# Patient Record
Sex: Male | Born: 1965 | Race: White | Hispanic: No | Marital: Married | Smoking: Never smoker
Health system: Southern US, Community
[De-identification: ages and names within clinical notes are randomized; demographics above are authoritative.]

## PROBLEM LIST (undated history)

## (undated) DIAGNOSIS — I2510 Atherosclerotic heart disease of native coronary artery without angina pectoris: Secondary | ICD-10-CM

## (undated) DIAGNOSIS — E785 Hyperlipidemia, unspecified: Secondary | ICD-10-CM

## (undated) DIAGNOSIS — E119 Type 2 diabetes mellitus without complications: Secondary | ICD-10-CM

## (undated) DIAGNOSIS — R42 Dizziness and giddiness: Secondary | ICD-10-CM

## (undated) DIAGNOSIS — I10 Essential (primary) hypertension: Secondary | ICD-10-CM

## (undated) HISTORY — PX: CARDIAC CATHETERIZATION: SHX172

---

## 1999-03-15 ENCOUNTER — Encounter: Admission: RE | Admit: 1999-03-15 | Discharge: 1999-06-13 | Payer: Self-pay | Admitting: *Deleted

## 2005-12-17 ENCOUNTER — Encounter: Admission: RE | Admit: 2005-12-17 | Discharge: 2005-12-17 | Payer: Self-pay | Admitting: Sports Medicine

## 2007-09-15 ENCOUNTER — Ambulatory Visit: Payer: Self-pay | Admitting: Internal Medicine

## 2007-10-06 ENCOUNTER — Ambulatory Visit: Payer: Self-pay | Admitting: Internal Medicine

## 2007-10-06 ENCOUNTER — Encounter: Payer: Self-pay | Admitting: Internal Medicine

## 2007-10-06 ENCOUNTER — Ambulatory Visit: Payer: Self-pay

## 2008-11-12 ENCOUNTER — Inpatient Hospital Stay (HOSPITAL_COMMUNITY): Admission: EM | Admit: 2008-11-12 | Discharge: 2008-11-13 | Payer: Self-pay | Admitting: Emergency Medicine

## 2009-10-19 IMAGING — CR DG ANKLE COMPLETE 3+V*R*
1 series · 1 of 1 positions shown · non-contrast
Comparison: None

CLINICAL DATA: Fall off of the roof.  Right leg and ankle trauma
and pain.

RIGHT ANKLE - COMPLETE 3+ VIEW

[t ankle joint lat right]
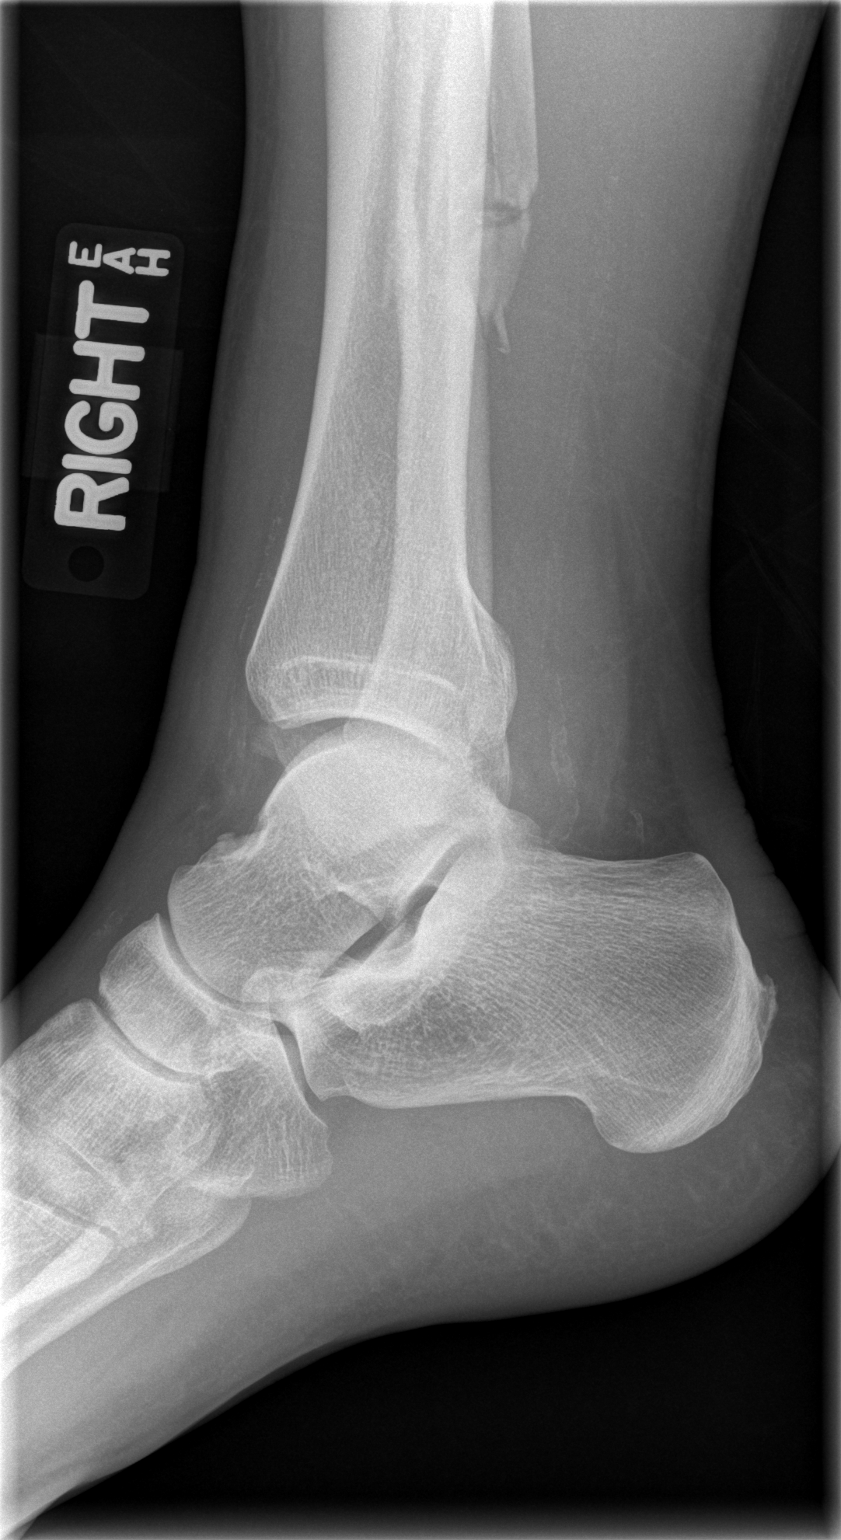

[1 of 1 positions shown; findings below may reference images not displayed]

FINDINGS: A comminuted fracture of the distal fibular shaft is
again seen.  A transverse fracture is also seen involving the
medial malleolus.  Probable posterior malleolar fractures also seen
on the lateral projection.  Lateral subluxation of the talus is
seen as well as lateral displacement of the medial malleolar
fracture fragment.

Peripheral vascular calcification is noted.
IMPRESSION: 1.  Comminuted fracture of the distal fibular shaft with mild
lateral angulation.
2.  Displaced fracture through the lateral malleolus and lateral
subluxation of the talus.
3.  Probable posterior malleolar fracture also noted on lateral
projection.

## 2010-11-14 ENCOUNTER — Telehealth (INDEPENDENT_AMBULATORY_CARE_PROVIDER_SITE_OTHER): Payer: Self-pay | Admitting: *Deleted

## 2010-12-27 NOTE — Progress Notes (Signed)
  Stress,12 Lead faxed to [facility] @ [phone] [facility]  November 14, 2010 9:23 AM

## 2011-04-09 NOTE — Op Note (Signed)
Anthony Townsend, Anthony Townsend                ACCOUNT NO.:  [id number]   MEDICAL RECORD NO.:  [id number]          PATIENT TYPE:  INP   LOCATION:  1826                         FACILITY:  [facility]   PHYSICIAN:  Claude Manges. Whitfield, M.D.DATE OF BIRTH:  Apr 18, 1966   DATE OF PROCEDURE:  11/12/2008  DATE OF DISCHARGE:                               OPERATIVE REPORT   PREOPERATIVE DIAGNOSIS:  Displaced medial malleolus fracture, right  ankle with diastasis of distal tibia-fibular joint.   POSTOPERATIVE DIAGNOSIS:  Displaced medial malleolus fracture, right  ankle with diastasis of distal tibia-fibular joint.   PROCEDURES:  1. Open reduction and internal fixation of medial malleolus fracture.  2. Open reduction and internal fixation of distal tibia-fibular joint      diastasis.   SURGEON:  Claude Manges. Cleophas Dunker, MD   ASSISTANT:  Oris Drone. Petrarca, PA-C   ANESTHESIA:  General.   COMPLICATIONS:  None.   PROCEDURE:  With the patient comfortable in the operating table and  under general orotracheal anesthesia, the right lower extremity was  placed in a thigh tourniquet.  The right lower extremity was prepped  with DuraPrep from the tips of the toes to the knee.  Sterile draping  was performed.  With the extremity still elevated, it was Esmarch  exsanguinated with a proximal tourniquet at 350 mmHg.   The patient had a displaced medial malleolus fracture.  The talus was  lateral to the joint associated with the diastasis of the distal tib-fib  joint.  There was also an associated midshaft comminuted fracture of the  fibula, all were closed injuries.  We elected not to fix the fibula  fracture, as it was well aligned and well above the ankle.   A skin incision was outlined with a semi-oblique incision directly over  the medial malleolus and via sharp dissection, incision was carried down  through the subcutaneous tissue and via blunt dissection, the  subcutaneous and deeper fascia were opened, so that we  could visualize  the fracture.  There was gross unclotted blood that was evacuated from  the wound.  There was about a 4 or 5 mm separation at the fracture site.  We actually enlarged the gap, so that we could evaluate the joint.  We  irrigated the joint, did not see any loose bodies or injury to the  talus.  Under direct visualization, the medial malleolus was placed  anatomically and maintained with a towel clamp.  Two 4.0 cannulated  screws were then inserted.  A guidepin was placed anterior and then one  more posterior to it.  We could visualize the posterior tibial tendon  was out of the joint and well out of the operative field.  We maintained  position with the towel clamp, checked the position of the guidepins  under image intensification in both the AP and lateral projections and  thought we were in excellent position.  Two 48-mm 4.0 screws were then  placed over the guidepins after appropriate drilling.  We had a very  nice tight fit to the screws, and after insertion of both screws, we  released the towel clamps, obtained films with the fracture line had  been basically obliterated.   The medial malleolar incision was just elongated slightly proximally, so  that we could visualize the distal tibia.  A second incision was then  made over the distal fibula and carried down to the subcutaneous tissue  via blunt dissection, the incision was carried down to the periosteum,  this was elevated so that exposing the fibula.  Under image  intensification, we found the appropriate position, inserted a K-wire  coursing to the opposite side, and under direct visualization, we  externalized the guidepin above the medial malleolus fracture.  A second  guidepin was then placed slightly more superior paralleling the course  of the first pin that was inserted more distally and it was also in  excellent position.  The tight rope devices were used.  We used two of  them.  The first was inserted  proximally and maintaining reduction with  a [employer] clamp.  The tight rope was tightened with one surgeon's knot  and maintained with a hemostat.  The second tight rope device was placed  about a centimeter distal to that.  In the same fashion, we tightened it  and maintained the surgeon's knot with a hemostat.  Again, we checked  position of the diastasis, thought we had excellent reduction and then  accordingly tightened both of the tight rope devices with 5 knots of the  FiberWire.  After releasing the [facility] clamp, we again checked  position, thought we were perfectly stable.  The FiberWire was then  released, the knots were placed parallel to the fibula, and covered with  periosteum and the subcu was then irrigated and closed with the 2-0  Vicryl.  We also closed the medial wound in similar fashion with 2-0  Vicryl, the skin was closed with Steri-Strips.  Marcaine without  epinephrine was injected into wound edges.   We also checked the position of the midshaft fibula fracture and thought  we had very nice position, it was comminuted but very nicely aligned.   The right lower extremity was then placed in a posterior splint and well  padded.  The tourniquet was deflated with immediate capillary refill to  the toes.   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   The patient tolerated the procedure without complications.      Claude Manges. Cleophas Dunker, M.D.  Electronically Signed     PWW/MEDQ  D:  11/12/2008  T:  11/13/2008  Job:  [id number]
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
## 2011-08-30 LAB — GLUCOSE, CAPILLARY

## 2011-08-30 LAB — POCT I-STAT, CHEM 8
BUN: 18 mg/dL (ref 6–23)
Glucose, Bld: 149 mg/dL — ABNORMAL HIGH (ref 70–99)
Sodium: 139 mEq/L (ref 135–145)
TCO2: 27 mmol/L (ref 0–100)

## 2011-08-30 LAB — BASIC METABOLIC PANEL
CO2: 28 mEq/L (ref 19–32)
Chloride: 103 mEq/L (ref 96–112)
Creatinine, Ser: 0.83 mg/dL (ref 0.4–1.5)
Glucose, Bld: 261 mg/dL — ABNORMAL HIGH (ref 70–99)
Potassium: 3.4 mEq/L — ABNORMAL LOW (ref 3.5–5.1)

## 2011-08-30 LAB — CBC
Platelets: 128 10*3/uL — ABNORMAL LOW (ref 150–400)
RBC: 3.59 MIL/uL — ABNORMAL LOW (ref 4.22–5.81)
RDW: 12.7 % (ref 11.5–15.5)

## 2011-08-30 LAB — URINALYSIS, ROUTINE W REFLEX MICROSCOPIC
Ketones, ur: 15 mg/dL — AB
Leukocytes, UA: NEGATIVE
Nitrite: NEGATIVE
Protein, ur: NEGATIVE mg/dL
Urobilinogen, UA: 0.2 mg/dL (ref 0.0–1.0)

## 2011-08-30 LAB — URINE MICROSCOPIC-ADD ON

## 2011-08-30 LAB — HEMOGLOBIN A1C
Hgb A1c MFr Bld: 8.9 % — ABNORMAL HIGH (ref 4.6–6.1)
Mean Plasma Glucose: 209 mg/dL

## 2017-02-11 ENCOUNTER — Other Ambulatory Visit: Payer: Self-pay | Admitting: Vascular Surgery

## 2017-02-11 DIAGNOSIS — I70231 Atherosclerosis of native arteries of right leg with ulceration of thigh: Secondary | ICD-10-CM

## 2017-03-19 ENCOUNTER — Encounter: Payer: Self-pay | Admitting: Vascular Surgery

## 2017-03-28 ENCOUNTER — Ambulatory Visit (HOSPITAL_COMMUNITY)
Admission: RE | Admit: 2017-03-28 | Discharge: 2017-03-28 | Disposition: A | Discharge status: Home / Self Care | Payer: BLUE CROSS/BLUE SHIELD | Source: Ambulatory Visit | Attending: Vascular Surgery | Admitting: Vascular Surgery

## 2017-03-28 ENCOUNTER — Encounter: Payer: Self-pay | Admitting: Vascular Surgery

## 2017-03-28 ENCOUNTER — Ambulatory Visit (INDEPENDENT_AMBULATORY_CARE_PROVIDER_SITE_OTHER): Payer: BLUE CROSS/BLUE SHIELD | Admitting: Vascular Surgery

## 2017-03-28 VITALS — BP 138/88 | HR 60 | Temp 98.2°F | Resp 16 | Ht 68.0 in | Wt 181.0 lb

## 2017-03-28 DIAGNOSIS — E109 Type 1 diabetes mellitus without complications: Secondary | ICD-10-CM | POA: Diagnosis not present

## 2017-03-28 DIAGNOSIS — I70231 Atherosclerosis of native arteries of right leg with ulceration of thigh: Secondary | ICD-10-CM | POA: Diagnosis not present

## 2017-03-28 NOTE — Progress Notes (Signed)
Patient ID: Anthony Townsend, male   DOB: Nov 06, 1966, 51 y.o.   MRN: [id number]  Reason for Consult: New Evaluation (Arterial dx.  Dr. Adrian Prince [phone].  No previous testing. )   Referred by No ref. provider found  Subjective:     HPI:  Anthony Townsend is a 51 y.o. male very healthy-appearing gentleman presents for evaluation of lower extremity arterial disease. He recently had an x-ray to evaluate for hip surgery that he is going to undergo soon for torn labrum and is hip. X-ray demonstrated calcifications in his femoral artery and without he was referred for vascular surgical evaluation. He is a lifetime nonsmoker. He is a type I diabetic diagnosed at the age of 31 but is well-controlled. He has been relatively active throughout his life until his hip injury. Only previous surgery includes right ankle surgery. He has never had a heart attack or stroke. He does take daily aspirin.  PMH: type I dm Family history: non contributory Surgical history: right ankle surgery  Short Social History:  Social History  Substance Use Topics  . Smoking status: Never Smoker  . Smokeless tobacco: Never Used  . Alcohol use Not on file    Allergies  Allergen Reactions  . Penicillins     Current Outpatient Prescriptions  Medication Sig Dispense Refill  . diltiazem (CARDIZEM CD) 180 MG 24 hr capsule Take 180 mg by mouth daily.    . insulin aspart (NOVOLOG) 100 UNIT/ML injection Inject 25 Units into the skin 3 (three) times daily before meals.    . insulin degludec (TRESIBA FLEXTOUCH) 100 UNIT/ML SOPN FlexTouch Pen Inject 25 Units into the skin daily at 10 pm.    . ramipril (ALTACE) 10 MG capsule Take 10 mg by mouth daily.     No current facility-administered medications for this visit.     Review of Systems  Constitutional:  Constitutional negative. Respiratory: Respiratory negative.  GI: Gastrointestinal negative.  Musculoskeletal: Positive for joint pain.  Neurological: Neurological  negative. Hematologic: Hematologic/lymphatic negative.  Psychiatric: Psychiatric negative.        Objective:  Objective   Vitals:   03/28/17 0924  BP: 138/88  Pulse: 60  Resp: 16  Temp: 98.2 F (36.8 C)  TempSrc: Oral  SpO2: 98%  Weight: 181 lb (82.1 kg)  Height: 5\' 8"  (1.727 m)   Body mass index is 27.52 kg/m.  Physical Exam  Constitutional: He is oriented to person, place, and time. He appears well-developed.  HENT:  Head: Normocephalic.  Neck: Normal range of motion.  Cardiovascular: Normal rate.   Pulses:      Carotid pulses are 2+ on the right side, and 2+ on the left side.      Radial pulses are 2+ on the right side, and 2+ on the left side.       Femoral pulses are 2+ on the right side, and 2+ on the left side.      Dorsalis pedis pulses are 2+ on the right side, and 2+ on the left side.  Pulmonary/Chest: Effort normal.  Abdominal: Soft. He exhibits no mass.  Musculoskeletal: Normal range of motion. He exhibits no edema.  Lymphadenopathy:    He has no cervical adenopathy.  Neurological: He is alert and oriented to person, place, and time.  Skin: Skin is warm and dry.  Psychiatric: He has a normal mood and affect. His behavior is normal. Judgment and thought content normal.    Data: I have independently interpreted his ABIs  today and they are bilaterally greater than 1.     Assessment/Plan:     51 year old male with type 1 diabetes and will need to undergo right hip surgery for a torn labrum. In the workup he was found to have calcifications of his arteries with his only risk factor being his diabetes. I have discussed with him taking aspirin lifelong although it could be stopped for his surgery if needed. He otherwise should control his diabetes and does not have any contraindications to proceeding with hip surgery. He stopped with this and can follow-up on a when necessary basis.     Maeola HarmanBrandon Christopher Elandra Powell MD [facility]

## 2017-03-31 ENCOUNTER — Encounter: Payer: Self-pay | Admitting: Endocrinology

## 2017-10-07 DIAGNOSIS — M25551 Pain in right hip: Secondary | ICD-10-CM | POA: Diagnosis not present

## 2017-10-07 DIAGNOSIS — E11319 Type 2 diabetes mellitus with unspecified diabetic retinopathy without macular edema: Secondary | ICD-10-CM | POA: Diagnosis not present

## 2017-10-07 DIAGNOSIS — Z1389 Encounter for screening for other disorder: Secondary | ICD-10-CM | POA: Diagnosis not present

## 2017-10-07 DIAGNOSIS — E1065 Type 1 diabetes mellitus with hyperglycemia: Secondary | ICD-10-CM | POA: Diagnosis not present

## 2017-10-07 DIAGNOSIS — I10 Essential (primary) hypertension: Secondary | ICD-10-CM | POA: Diagnosis not present

## 2017-10-07 DIAGNOSIS — Z23 Encounter for immunization: Secondary | ICD-10-CM | POA: Diagnosis not present

## 2018-02-06 DIAGNOSIS — E11319 Type 2 diabetes mellitus with unspecified diabetic retinopathy without macular edema: Secondary | ICD-10-CM | POA: Diagnosis not present

## 2018-02-06 DIAGNOSIS — I10 Essential (primary) hypertension: Secondary | ICD-10-CM | POA: Diagnosis not present

## 2018-02-06 DIAGNOSIS — M25551 Pain in right hip: Secondary | ICD-10-CM | POA: Diagnosis not present

## 2018-02-06 DIAGNOSIS — E1065 Type 1 diabetes mellitus with hyperglycemia: Secondary | ICD-10-CM | POA: Diagnosis not present

## 2018-03-06 DIAGNOSIS — M25551 Pain in right hip: Secondary | ICD-10-CM | POA: Diagnosis not present

## 2018-03-06 DIAGNOSIS — Z4681 Encounter for fitting and adjustment of insulin pump: Secondary | ICD-10-CM | POA: Diagnosis not present

## 2018-03-06 DIAGNOSIS — M76891 Other specified enthesopathies of right lower limb, excluding foot: Secondary | ICD-10-CM | POA: Diagnosis not present

## 2018-03-06 DIAGNOSIS — Z6825 Body mass index (BMI) 25.0-25.9, adult: Secondary | ICD-10-CM | POA: Diagnosis not present

## 2018-03-06 DIAGNOSIS — I10 Essential (primary) hypertension: Secondary | ICD-10-CM | POA: Diagnosis not present

## 2018-03-06 DIAGNOSIS — E1065 Type 1 diabetes mellitus with hyperglycemia: Secondary | ICD-10-CM | POA: Diagnosis not present

## 2018-03-16 DIAGNOSIS — Z1211 Encounter for screening for malignant neoplasm of colon: Secondary | ICD-10-CM | POA: Diagnosis not present

## 2018-03-16 DIAGNOSIS — K644 Residual hemorrhoidal skin tags: Secondary | ICD-10-CM | POA: Diagnosis not present

## 2018-03-28 DIAGNOSIS — M25551 Pain in right hip: Secondary | ICD-10-CM | POA: Diagnosis not present

## 2018-04-13 DIAGNOSIS — M24151 Other articular cartilage disorders, right hip: Secondary | ICD-10-CM | POA: Diagnosis not present

## 2018-04-13 DIAGNOSIS — E119 Type 2 diabetes mellitus without complications: Secondary | ICD-10-CM | POA: Diagnosis not present

## 2018-04-13 DIAGNOSIS — M25551 Pain in right hip: Secondary | ICD-10-CM | POA: Diagnosis not present

## 2018-04-13 DIAGNOSIS — Z880 Allergy status to penicillin: Secondary | ICD-10-CM | POA: Diagnosis not present

## 2018-04-13 DIAGNOSIS — M25851 Other specified joint disorders, right hip: Secondary | ICD-10-CM | POA: Diagnosis not present

## 2018-04-13 DIAGNOSIS — M76891 Other specified enthesopathies of right lower limb, excluding foot: Secondary | ICD-10-CM | POA: Diagnosis not present

## 2018-04-13 DIAGNOSIS — Z9641 Presence of insulin pump (external) (internal): Secondary | ICD-10-CM | POA: Diagnosis not present

## 2018-04-13 DIAGNOSIS — Z7982 Long term (current) use of aspirin: Secondary | ICD-10-CM | POA: Diagnosis not present

## 2018-04-13 DIAGNOSIS — M1611 Unilateral primary osteoarthritis, right hip: Secondary | ICD-10-CM | POA: Diagnosis not present

## 2018-04-13 DIAGNOSIS — M25859 Other specified joint disorders, unspecified hip: Secondary | ICD-10-CM | POA: Diagnosis not present

## 2018-04-14 DIAGNOSIS — Z9641 Presence of insulin pump (external) (internal): Secondary | ICD-10-CM | POA: Diagnosis not present

## 2018-04-14 DIAGNOSIS — E119 Type 2 diabetes mellitus without complications: Secondary | ICD-10-CM | POA: Diagnosis not present

## 2018-04-14 DIAGNOSIS — M76891 Other specified enthesopathies of right lower limb, excluding foot: Secondary | ICD-10-CM | POA: Diagnosis not present

## 2018-04-14 DIAGNOSIS — Z7982 Long term (current) use of aspirin: Secondary | ICD-10-CM | POA: Diagnosis not present

## 2018-04-14 DIAGNOSIS — M25551 Pain in right hip: Secondary | ICD-10-CM | POA: Diagnosis not present

## 2018-04-14 DIAGNOSIS — Z4889 Encounter for other specified surgical aftercare: Secondary | ICD-10-CM | POA: Diagnosis not present

## 2018-04-14 DIAGNOSIS — Z880 Allergy status to penicillin: Secondary | ICD-10-CM | POA: Diagnosis not present

## 2018-04-14 DIAGNOSIS — I890 Lymphedema, not elsewhere classified: Secondary | ICD-10-CM | POA: Diagnosis not present

## 2018-04-14 DIAGNOSIS — M24151 Other articular cartilage disorders, right hip: Secondary | ICD-10-CM | POA: Diagnosis not present

## 2018-04-14 DIAGNOSIS — M25851 Other specified joint disorders, right hip: Secondary | ICD-10-CM | POA: Diagnosis not present

## 2018-04-15 DIAGNOSIS — M25551 Pain in right hip: Secondary | ICD-10-CM | POA: Diagnosis not present

## 2018-04-15 DIAGNOSIS — R262 Difficulty in walking, not elsewhere classified: Secondary | ICD-10-CM | POA: Diagnosis not present

## 2018-04-22 DIAGNOSIS — R262 Difficulty in walking, not elsewhere classified: Secondary | ICD-10-CM | POA: Diagnosis not present

## 2018-04-22 DIAGNOSIS — M25551 Pain in right hip: Secondary | ICD-10-CM | POA: Diagnosis not present

## 2018-04-29 DIAGNOSIS — M76891 Other specified enthesopathies of right lower limb, excluding foot: Secondary | ICD-10-CM | POA: Diagnosis not present

## 2018-04-29 DIAGNOSIS — Z4889 Encounter for other specified surgical aftercare: Secondary | ICD-10-CM | POA: Diagnosis not present

## 2018-04-29 DIAGNOSIS — I890 Lymphedema, not elsewhere classified: Secondary | ICD-10-CM | POA: Diagnosis not present

## 2018-04-29 DIAGNOSIS — M25551 Pain in right hip: Secondary | ICD-10-CM | POA: Diagnosis not present

## 2018-04-29 DIAGNOSIS — R262 Difficulty in walking, not elsewhere classified: Secondary | ICD-10-CM | POA: Diagnosis not present

## 2018-04-30 DIAGNOSIS — I10 Essential (primary) hypertension: Secondary | ICD-10-CM | POA: Diagnosis not present

## 2018-04-30 DIAGNOSIS — Z4681 Encounter for fitting and adjustment of insulin pump: Secondary | ICD-10-CM | POA: Diagnosis not present

## 2018-04-30 DIAGNOSIS — M25551 Pain in right hip: Secondary | ICD-10-CM | POA: Diagnosis not present

## 2018-04-30 DIAGNOSIS — R262 Difficulty in walking, not elsewhere classified: Secondary | ICD-10-CM | POA: Diagnosis not present

## 2018-04-30 DIAGNOSIS — E1065 Type 1 diabetes mellitus with hyperglycemia: Secondary | ICD-10-CM | POA: Diagnosis not present

## 2018-05-05 DIAGNOSIS — M25551 Pain in right hip: Secondary | ICD-10-CM | POA: Diagnosis not present

## 2018-05-05 DIAGNOSIS — R262 Difficulty in walking, not elsewhere classified: Secondary | ICD-10-CM | POA: Diagnosis not present

## 2018-05-07 DIAGNOSIS — M25551 Pain in right hip: Secondary | ICD-10-CM | POA: Diagnosis not present

## 2018-05-07 DIAGNOSIS — R262 Difficulty in walking, not elsewhere classified: Secondary | ICD-10-CM | POA: Diagnosis not present

## 2018-05-11 DIAGNOSIS — M25551 Pain in right hip: Secondary | ICD-10-CM | POA: Diagnosis not present

## 2018-05-11 DIAGNOSIS — R262 Difficulty in walking, not elsewhere classified: Secondary | ICD-10-CM | POA: Diagnosis not present

## 2018-05-13 DIAGNOSIS — M25551 Pain in right hip: Secondary | ICD-10-CM | POA: Diagnosis not present

## 2018-05-13 DIAGNOSIS — R262 Difficulty in walking, not elsewhere classified: Secondary | ICD-10-CM | POA: Diagnosis not present

## 2018-05-18 DIAGNOSIS — M25551 Pain in right hip: Secondary | ICD-10-CM | POA: Diagnosis not present

## 2018-05-18 DIAGNOSIS — R262 Difficulty in walking, not elsewhere classified: Secondary | ICD-10-CM | POA: Diagnosis not present

## 2018-05-19 DIAGNOSIS — E11319 Type 2 diabetes mellitus with unspecified diabetic retinopathy without macular edema: Secondary | ICD-10-CM | POA: Diagnosis not present

## 2018-05-19 DIAGNOSIS — E1065 Type 1 diabetes mellitus with hyperglycemia: Secondary | ICD-10-CM | POA: Diagnosis not present

## 2018-05-19 DIAGNOSIS — I10 Essential (primary) hypertension: Secondary | ICD-10-CM | POA: Diagnosis not present

## 2018-05-19 DIAGNOSIS — M25551 Pain in right hip: Secondary | ICD-10-CM | POA: Diagnosis not present

## 2018-05-19 DIAGNOSIS — Z1389 Encounter for screening for other disorder: Secondary | ICD-10-CM | POA: Diagnosis not present

## 2018-05-20 DIAGNOSIS — R262 Difficulty in walking, not elsewhere classified: Secondary | ICD-10-CM | POA: Diagnosis not present

## 2018-05-20 DIAGNOSIS — M25551 Pain in right hip: Secondary | ICD-10-CM | POA: Diagnosis not present

## 2018-05-27 DIAGNOSIS — R262 Difficulty in walking, not elsewhere classified: Secondary | ICD-10-CM | POA: Diagnosis not present

## 2018-05-27 DIAGNOSIS — M25551 Pain in right hip: Secondary | ICD-10-CM | POA: Diagnosis not present

## 2018-06-02 DIAGNOSIS — M25551 Pain in right hip: Secondary | ICD-10-CM | POA: Diagnosis not present

## 2018-06-02 DIAGNOSIS — R262 Difficulty in walking, not elsewhere classified: Secondary | ICD-10-CM | POA: Diagnosis not present

## 2018-06-10 DIAGNOSIS — M25551 Pain in right hip: Secondary | ICD-10-CM | POA: Diagnosis not present

## 2018-06-10 DIAGNOSIS — R262 Difficulty in walking, not elsewhere classified: Secondary | ICD-10-CM | POA: Diagnosis not present

## 2018-06-16 DIAGNOSIS — M25551 Pain in right hip: Secondary | ICD-10-CM | POA: Diagnosis not present

## 2018-06-16 DIAGNOSIS — R262 Difficulty in walking, not elsewhere classified: Secondary | ICD-10-CM | POA: Diagnosis not present

## 2018-06-30 DIAGNOSIS — R262 Difficulty in walking, not elsewhere classified: Secondary | ICD-10-CM | POA: Diagnosis not present

## 2018-06-30 DIAGNOSIS — M25551 Pain in right hip: Secondary | ICD-10-CM | POA: Diagnosis not present

## 2018-07-07 DIAGNOSIS — R262 Difficulty in walking, not elsewhere classified: Secondary | ICD-10-CM | POA: Diagnosis not present

## 2018-07-07 DIAGNOSIS — M25551 Pain in right hip: Secondary | ICD-10-CM | POA: Diagnosis not present

## 2018-07-14 DIAGNOSIS — M76891 Other specified enthesopathies of right lower limb, excluding foot: Secondary | ICD-10-CM | POA: Diagnosis not present

## 2018-07-28 DIAGNOSIS — Z23 Encounter for immunization: Secondary | ICD-10-CM | POA: Diagnosis not present

## 2018-08-05 DIAGNOSIS — Z4681 Encounter for fitting and adjustment of insulin pump: Secondary | ICD-10-CM | POA: Diagnosis not present

## 2018-08-05 DIAGNOSIS — Z6827 Body mass index (BMI) 27.0-27.9, adult: Secondary | ICD-10-CM | POA: Diagnosis not present

## 2018-08-05 DIAGNOSIS — E1065 Type 1 diabetes mellitus with hyperglycemia: Secondary | ICD-10-CM | POA: Diagnosis not present

## 2018-08-05 DIAGNOSIS — I10 Essential (primary) hypertension: Secondary | ICD-10-CM | POA: Diagnosis not present

## 2018-08-27 DIAGNOSIS — M654 Radial styloid tenosynovitis [de Quervain]: Secondary | ICD-10-CM | POA: Diagnosis not present

## 2018-09-16 DIAGNOSIS — M720 Palmar fascial fibromatosis [Dupuytren]: Secondary | ICD-10-CM | POA: Diagnosis not present

## 2018-10-12 DIAGNOSIS — Z6827 Body mass index (BMI) 27.0-27.9, adult: Secondary | ICD-10-CM | POA: Diagnosis not present

## 2018-10-12 DIAGNOSIS — R001 Bradycardia, unspecified: Secondary | ICD-10-CM | POA: Diagnosis not present

## 2018-10-12 DIAGNOSIS — E10319 Type 1 diabetes mellitus with unspecified diabetic retinopathy without macular edema: Secondary | ICD-10-CM | POA: Diagnosis not present

## 2018-10-12 DIAGNOSIS — H8110 Benign paroxysmal vertigo, unspecified ear: Secondary | ICD-10-CM | POA: Diagnosis not present

## 2018-10-15 DIAGNOSIS — Z0189 Encounter for other specified special examinations: Secondary | ICD-10-CM | POA: Diagnosis not present

## 2018-10-15 DIAGNOSIS — R42 Dizziness and giddiness: Secondary | ICD-10-CM | POA: Diagnosis not present

## 2018-10-15 DIAGNOSIS — R001 Bradycardia, unspecified: Secondary | ICD-10-CM | POA: Diagnosis not present

## 2018-10-15 DIAGNOSIS — E103293 Type 1 diabetes mellitus with mild nonproliferative diabetic retinopathy without macular edema, bilateral: Secondary | ICD-10-CM | POA: Diagnosis not present

## 2018-10-16 DIAGNOSIS — Z9889 Other specified postprocedural states: Secondary | ICD-10-CM | POA: Diagnosis not present

## 2018-10-27 DIAGNOSIS — E10319 Type 1 diabetes mellitus with unspecified diabetic retinopathy without macular edema: Secondary | ICD-10-CM | POA: Diagnosis not present

## 2018-10-27 DIAGNOSIS — R42 Dizziness and giddiness: Secondary | ICD-10-CM | POA: Diagnosis not present

## 2018-10-27 DIAGNOSIS — E1065 Type 1 diabetes mellitus with hyperglycemia: Secondary | ICD-10-CM | POA: Diagnosis not present

## 2018-10-27 DIAGNOSIS — I10 Essential (primary) hypertension: Secondary | ICD-10-CM | POA: Diagnosis not present

## 2018-10-27 DIAGNOSIS — R002 Palpitations: Secondary | ICD-10-CM | POA: Diagnosis not present

## 2018-11-05 DIAGNOSIS — Z6827 Body mass index (BMI) 27.0-27.9, adult: Secondary | ICD-10-CM | POA: Diagnosis not present

## 2018-11-05 DIAGNOSIS — I10 Essential (primary) hypertension: Secondary | ICD-10-CM | POA: Diagnosis not present

## 2018-11-05 DIAGNOSIS — E1065 Type 1 diabetes mellitus with hyperglycemia: Secondary | ICD-10-CM | POA: Diagnosis not present

## 2018-11-05 DIAGNOSIS — Z4681 Encounter for fitting and adjustment of insulin pump: Secondary | ICD-10-CM | POA: Diagnosis not present

## 2018-11-09 DIAGNOSIS — R001 Bradycardia, unspecified: Secondary | ICD-10-CM | POA: Diagnosis not present

## 2019-03-23 DIAGNOSIS — M25572 Pain in left ankle and joints of left foot: Secondary | ICD-10-CM | POA: Diagnosis not present

## 2019-04-09 DIAGNOSIS — R42 Dizziness and giddiness: Secondary | ICD-10-CM | POA: Diagnosis not present

## 2019-04-09 DIAGNOSIS — E1065 Type 1 diabetes mellitus with hyperglycemia: Secondary | ICD-10-CM | POA: Diagnosis not present

## 2019-04-09 DIAGNOSIS — E10319 Type 1 diabetes mellitus with unspecified diabetic retinopathy without macular edema: Secondary | ICD-10-CM | POA: Diagnosis not present

## 2019-04-09 DIAGNOSIS — M25551 Pain in right hip: Secondary | ICD-10-CM | POA: Diagnosis not present

## 2019-04-09 DIAGNOSIS — Z1331 Encounter for screening for depression: Secondary | ICD-10-CM | POA: Diagnosis not present

## 2019-04-15 ENCOUNTER — Other Ambulatory Visit: Payer: Self-pay | Admitting: Endocrinology

## 2019-04-21 ENCOUNTER — Other Ambulatory Visit: Payer: Self-pay | Admitting: Endocrinology

## 2019-04-21 DIAGNOSIS — M79672 Pain in left foot: Secondary | ICD-10-CM

## 2019-08-02 DIAGNOSIS — Z20828 Contact with and (suspected) exposure to other viral communicable diseases: Secondary | ICD-10-CM | POA: Diagnosis not present

## 2019-08-13 DIAGNOSIS — M79672 Pain in left foot: Secondary | ICD-10-CM | POA: Diagnosis not present

## 2019-08-13 DIAGNOSIS — E1065 Type 1 diabetes mellitus with hyperglycemia: Secondary | ICD-10-CM | POA: Diagnosis not present

## 2019-08-13 DIAGNOSIS — Z794 Long term (current) use of insulin: Secondary | ICD-10-CM | POA: Diagnosis not present

## 2019-08-13 DIAGNOSIS — E10319 Type 1 diabetes mellitus with unspecified diabetic retinopathy without macular edema: Secondary | ICD-10-CM | POA: Diagnosis not present

## 2019-11-11 DIAGNOSIS — Z23 Encounter for immunization: Secondary | ICD-10-CM | POA: Diagnosis not present

## 2019-12-03 DIAGNOSIS — E1065 Type 1 diabetes mellitus with hyperglycemia: Secondary | ICD-10-CM | POA: Diagnosis not present

## 2019-12-03 DIAGNOSIS — E10319 Type 1 diabetes mellitus with unspecified diabetic retinopathy without macular edema: Secondary | ICD-10-CM | POA: Diagnosis not present

## 2019-12-03 DIAGNOSIS — I10 Essential (primary) hypertension: Secondary | ICD-10-CM | POA: Diagnosis not present

## 2019-12-03 DIAGNOSIS — Z794 Long term (current) use of insulin: Secondary | ICD-10-CM | POA: Diagnosis not present

## 2019-12-23 DIAGNOSIS — E1065 Type 1 diabetes mellitus with hyperglycemia: Secondary | ICD-10-CM | POA: Diagnosis not present

## 2020-04-05 DIAGNOSIS — E10319 Type 1 diabetes mellitus with unspecified diabetic retinopathy without macular edema: Secondary | ICD-10-CM | POA: Diagnosis not present

## 2020-04-05 DIAGNOSIS — Z794 Long term (current) use of insulin: Secondary | ICD-10-CM | POA: Diagnosis not present

## 2020-04-05 DIAGNOSIS — I10 Essential (primary) hypertension: Secondary | ICD-10-CM | POA: Diagnosis not present

## 2020-04-05 DIAGNOSIS — E1065 Type 1 diabetes mellitus with hyperglycemia: Secondary | ICD-10-CM | POA: Diagnosis not present

## 2024-10-29 DIAGNOSIS — I2111 ST elevation (STEMI) myocardial infarction involving right coronary artery: Secondary | ICD-10-CM

## 2024-10-29 HISTORY — DX: ST elevation (STEMI) myocardial infarction involving right coronary artery: I21.11

## 2024-10-29 HISTORY — PX: CORONARY STENT PLACEMENT: SHX6853

## 2024-11-09 HISTORY — PX: CORONARY STENT PLACEMENT: SHX6853

## 2024-11-16 ENCOUNTER — Encounter (HOSPITAL_COMMUNITY): Payer: Self-pay

## 2024-11-16 ENCOUNTER — Telehealth (HOSPITAL_COMMUNITY): Payer: Self-pay

## 2024-11-16 NOTE — Telephone Encounter (Signed)
 Outside/paper referral received by Dr. Nouredinne from [facility]. Will fax over request for EKG if patient confirms interest. Insurance benefits and eligibility to be determined.   Attempted to reach patient regarding cardiac rehab- no answer, left message. Mailed letter.

## 2024-11-30 ENCOUNTER — Telehealth (HOSPITAL_COMMUNITY): Payer: Self-pay

## 2024-11-30 NOTE — Telephone Encounter (Signed)
 Received EKG and called patient to see if he was interested in Cardiac rehab, Patient had spoken with Hailey already to.  She will create referral and schedule once he is clear by Nurse Navigator.

## 2024-11-30 NOTE — Telephone Encounter (Signed)
 Attempted f/u call regarding interest in cardiac rehab- no answer, left message.  Closing referral.

## 2024-12-01 ENCOUNTER — Telehealth (HOSPITAL_COMMUNITY): Payer: Self-pay

## 2024-12-01 NOTE — Telephone Encounter (Signed)
 Called patient to see if he was interested in participating in the Cardiac Rehab Program. Patient will come in for orientation on 1/15 and will attend the 8:15 exercise class.  Pensions consultant.

## 2024-12-01 NOTE — Telephone Encounter (Signed)
 Pt insurance is active and benefits verified through [facility]. Co-pay $20, DED $0/$0 met, out of pocket $4,500/$13.56 met, co-insurance 0%. No pre-authorization required. 12/01/2024 @ 3:36pm, spoke with Alan GAILS., REF# [id number].  TCR/ICR? ICR Visit(date of service)limitation? No Can multiple codes be used on the same date of service/visit?(IF ITS A LIMIT) N/A  Is this a lifetime maximum or an annual maximum? Annual Has the member used any of these services to date? No Is there a time limit (weeks/months) on start of program and/or program completion? No

## 2024-12-08 ENCOUNTER — Telehealth (HOSPITAL_COMMUNITY): Payer: Self-pay | Admitting: *Deleted

## 2024-12-08 NOTE — Telephone Encounter (Signed)
 Spoke with Anthony Townsend. Completed health history. Confirmed appointment for tomorrow's orientation.[name] RN BSN

## 2024-12-09 ENCOUNTER — Encounter (HOSPITAL_COMMUNITY): Payer: Self-pay

## 2024-12-09 ENCOUNTER — Encounter (HOSPITAL_COMMUNITY)
Admission: RE | Admit: 2024-12-09 | Discharge: 2024-12-09 | Disposition: A | Discharge status: Home / Self Care | Source: Ambulatory Visit | Attending: Cardiology | Admitting: Cardiology

## 2024-12-09 VITALS — BP 132/70 | HR 86 | Ht 68.75 in | Wt 183.6 lb

## 2024-12-09 DIAGNOSIS — I252 Old myocardial infarction: Secondary | ICD-10-CM | POA: Diagnosis not present

## 2024-12-09 DIAGNOSIS — Z955 Presence of coronary angioplasty implant and graft: Secondary | ICD-10-CM | POA: Insufficient documentation

## 2024-12-09 DIAGNOSIS — I2102 ST elevation (STEMI) myocardial infarction involving left anterior descending coronary artery: Secondary | ICD-10-CM

## 2024-12-09 DIAGNOSIS — I213 ST elevation (STEMI) myocardial infarction of unspecified site: Secondary | ICD-10-CM | POA: Diagnosis present

## 2024-12-09 DIAGNOSIS — Z48812 Encounter for surgical aftercare following surgery on the circulatory system: Secondary | ICD-10-CM | POA: Insufficient documentation

## 2024-12-09 HISTORY — DX: Hyperlipidemia, unspecified: E78.5

## 2024-12-09 HISTORY — DX: Type 2 diabetes mellitus without complications: E11.9

## 2024-12-09 HISTORY — DX: Dizziness and giddiness: R42

## 2024-12-09 HISTORY — DX: Atherosclerotic heart disease of native coronary artery without angina pectoris: I25.10

## 2024-12-09 HISTORY — DX: Essential (primary) hypertension: I10

## 2024-12-09 NOTE — Progress Notes (Signed)
 Cardiac Rehab Medication Review by a Nurse  Does the patient  feel that his/her medications are working for him/her?  yes  Has the patient been experiencing any side effects to the medications prescribed?  no  Does the patient measure his/her own blood pressure or blood glucose at home?  yes   Does the patient have any problems obtaining medications due to transportation or finances?   no  Understanding of regimen: excellent Understanding of indications: excellent Potential of compliance: excellent    Nurse comments: Crew is taking his medications as prescribed and has a good understanding of what his medications are for. [name] has a continuous glucose meter as [name] is on an insulin pump. [name] has a BP cuff/monitor. [name] checks his blood pressures intermittently.    [name] RN 12/09/2024 8:31 AM

## 2024-12-09 NOTE — Progress Notes (Signed)
 Cardiac Individual Treatment Plan  Patient Details  Name: Anthony Townsend MRN: [id number] Date of Birth: 08/15/1966 Referring Provider:   Flowsheet Row INTENSIVE CARDIAC REHAB ORIENT from 12/09/2024 in [facility]  Referring Provider Dr. Leonard (Dr. 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   TCO2 0 - 100 mmol/L 27  -    Capillary Blood Glucose: Lab Results  Component Value Date   GLUCAP 249 (H) 11/13/2008   GLUCAP 137 (H) 11/12/2008   GLUCAP 151 (H) 11/12/2008   GLUCAP 156 (H) 11/12/2008     Exercise Target Goals: Exercise Program Goal: Individual exercise prescription set using results from initial 6 min walk test and THRR while considering  patients activity barriers and safety.   Exercise Prescription Goal: Initial exercise prescription builds to 30-45 minutes a day of  aerobic activity, 2-3 days per week.  Home exercise guidelines will be given to patient during program as part of exercise prescription that the participant will acknowledge.  Activity Barriers & Risk Stratification:  Activity Barriers & Cardiac Risk Stratification - 12/09/24 1019       Activity Barriers & Cardiac Risk Stratification   Activity Barriers Other (comment)    Comments Pt does have h/o vertigo    Cardiac Risk Stratification High          6 Minute Walk:  6 Minute Walk     Row Name 12/09/24 1018         6 Minute Walk   Phase Initial     Distance 1450 feet     Walk Time 6 minutes     # of Rest Breaks 0     MPH 2.75     METS 3.85     RPE 6     Perceived Dyspnea  0     VO2 Peak 13.5     Symptoms No     Resting HR 65 bpm     Resting BP 132/70     Resting Oxygen Saturation  100 %     Exercise Oxygen Saturation  during 6 min walk 100 %     Max Ex. HR 86 bpm     Max Ex. BP 142/90     2 Minute Post BP 150/82        Oxygen Initial Assessment:  Oxygen Re-Evaluation:   Oxygen Discharge (Final Oxygen Re-Evaluation):   Initial Exercise Prescription:  Initial Exercise Prescription - 12/09/24 0900       Date of Initial Exercise RX and Referring Provider   Date 12/09/24    Referring Provider Dr. Leonard (Dr. Wilbert Bihari MD)    Expected Discharge Date 03/04/24      Treadmill   MPH 3.7   HITT, Jog at 5.0 mph   Grade 0    Minutes 15    METs 3.85      Arm Ergometer   Level 1.5    Watts 50    RPM 60    Minutes 15    METs 3.85      Prescription Details   Frequency (times per week) 3    Duration Progress to 30 minutes of continuous aerobic without signs/symptoms of physical distress      Intensity   THRR 40-80% of Max Heartrate 63-126    Ratings of Perceived Exertion 11-13    Perceived Dyspnea 0-4      Progression   Progression Continue progressive overload as per policy without signs/symptoms or physical distress.      Resistance Training    Training Prescription Yes    Weight 5 lbs    Reps 10-15          Perform Capillary Blood Glucose checks as needed.  Exercise Prescription Changes:   Exercise Comments:   Exercise Goals and Review:   Exercise Goals     Row Name 12/09/24 0902             Exercise Goals   Increase Physical Activity Yes       Intervention Provide advice, education, support and counseling about physical activity/exercise needs.;Develop an individualized exercise prescription for aerobic and resistive training based on initial evaluation findings, risk stratification, comorbidities and participant's personal goals.       Expected Outcomes Short Term: Attend rehab on a regular basis to increase amount of physical activity.;Long Term: Exercising regularly at least 3-5 days a week.;Long Term: Add in home exercise to make exercise part of routine and to increase amount of physical activity.       Increase Strength and Stamina Yes       Intervention Provide advice, education, support and counseling about physical activity/exercise needs.;Develop an individualized exercise prescription for aerobic and resistive training based on initial evaluation findings, risk stratification, comorbidities and participant's personal goals.       Expected Outcomes Short Term: Increase workloads from initial exercise prescription for resistance, speed, and METs.;Short Term: Perform resistance training exercises routinely during rehab and add in resistance training at home;Long Term: Improve cardiorespiratory fitness, muscular endurance and strength as measured by increased METs and functional capacity ( )       Able to understand and use rate of perceived exertion (RPE) scale Yes       Intervention Provide education and explanation on how to use RPE scale       Expected Outcomes Short Term: Able to use RPE daily in rehab to express subjective intensity level;Long Term:  Able to use RPE to guide intensity level when  exercising independently       Knowledge and understanding of Target Heart Rate Range (THRR) Yes       Intervention Provide education and explanation of THRR including how the numbers were predicted and where they are located for reference       Expected Outcomes Short Term: Able to state/look up THRR;Long  Term: Able to use THRR to govern intensity when exercising independently;Short Term: Able to use daily as guideline for intensity in rehab       Understanding of Exercise Prescription Yes       Intervention Provide education, explanation, and written materials on patient's individual exercise prescription       Expected Outcomes Short Term: Able to explain program exercise prescription;Long Term: Able to explain home exercise prescription to exercise independently          Exercise Goals Re-Evaluation :   Discharge Exercise Prescription (Final Exercise Prescription Changes):   Nutrition:  Target Goals: Understanding of nutrition guidelines, daily intake of sodium 1500mg , cholesterol 200mg , calories 30% from fat and 7% or less from saturated fats, daily to have 5 or more servings of fruits and vegetables.  Biometrics:  Pre Biometrics - 12/09/24 0830       Pre Biometrics   Waist Circumference 35.75 inches    Hip Circumference 39.75 inches    Waist to Hip Ratio 0.9 %    Triceps Skinfold 15 mm    % Body Fat 24.9 %    Grip Strength 36 kg    Flexibility 14.75 in    Single Leg Stand 30 seconds           Nutrition Therapy Plan and Nutrition Goals:   Nutrition Assessments:  MEDIFICTS Score Key: >=70 Need to make dietary changes  40-70 Heart Healthy Diet <= 40 Therapeutic Level Cholesterol Diet    Picture Your Plate Scores: <59 Unhealthy dietary pattern with much room for improvement. 41-50 Dietary pattern unlikely to meet recommendations for good health and room for improvement. 51-60 More healthful dietary pattern, with some room for improvement.  >60 Healthy dietary  pattern, although there may be some specific behaviors that could be improved.    Nutrition Goals Re-Evaluation:   Nutrition Goals Re-Evaluation:   Nutrition Goals Discharge (Final Nutrition Goals Re-Evaluation):   Psychosocial: Target Goals: Acknowledge presence or absence of significant depression and/or stress, maximize coping skills, provide positive support system. Participant is able to verbalize types and ability to use techniques and skills needed for reducing stress and depression.  Initial Review & Psychosocial Screening:  Initial Psych Review & Screening - 12/09/24 0912       Initial Review   Current issues with Current Anxiety/Panic;Current Stress Concerns    Source of Stress Concerns Chronic Illness    Comments Chukwuebuka says having the MI caught him off gaurd as he is very active and is in good health      Family Dynamics   Good Support System? Yes   Attilio has his wife and firends for support.     Barriers   Psychosocial barriers to participate in program The patient should benefit from training in stress management and relaxation.      Screening Interventions   Interventions Encouraged to exercise;Provide feedback about the scores to participant    Expected Outcomes Long Term Goal: Stressors or current issues are controlled or eliminated.;Long Term goal: The participant improves quality of Life and PHQ9 Scores as seen by post scores and/or verbalization of changes;Short Term goal: Identification and review with participant of any Quality of Life or Depression concerns found by scoring the questionnaire.          Quality of Life Scores:  Quality of Life - 12/09/24 1015       Quality of Life   Select Quality of Life      Quality of Life Scores  Health/Function Pre 27.73 %    Socioeconomic Pre 27.5 %    Psych/Spiritual Pre 27 %    Family Pre 27.6 %    GLOBAL Pre 27.53 %         Scores of 19 and below usually indicate a poorer quality of life in these  areas.  A difference of  2-3 points is a clinically meaningful difference.  A difference of 2-3 points in the total score of the Quality of Life Index has been associated with significant improvement in overall quality of life, self-image, physical symptoms, and general health in studies assessing change in quality of life.  PHQ-9: Review Flowsheet       12/09/2024  Depression screen PHQ 2/9  Decreased Interest 0  Down, Depressed, Hopeless 0  PHQ - 2 Score 0  Altered sleeping 1  Tired, decreased energy 0  Change in appetite 0  Feeling bad or failure about yourself  1  Trouble concentrating 0  Moving slowly or fidgety/restless 0  Suicidal thoughts 0  PHQ-9 Score 2  Difficult doing work/chores Not difficult at all   Interpretation of Total Score  Total Score Depression Severity:  1-4 = Minimal depression, 5-9 = Mild depression, 10-14 = Moderate depression, 15-19 = Moderately severe depression, 20-27 = Severe depression   Psychosocial Evaluation and Intervention:   Psychosocial Re-Evaluation:   Psychosocial Discharge (Final Psychosocial Re-Evaluation):   Vocational Rehabilitation: Provide vocational rehab assistance to qualifying candidates.   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 Calvert does not need vocational rehab at this time         Education: Education Goals: Education classes will be provided on a weekly basis, covering required topics. Participant will state understanding/return demonstration of topics presented.     Core Videos: Exercise    Move It!  Clinical staff conducted group or individual video education with verbal and written material and guidebook.  Patient learns the recommended Pritikin exercise program. Exercise with the goal of living a long, healthy  life. Some of the health benefits of exercise include controlled diabetes, healthier blood pressure levels, improved cholesterol levels, improved heart and lung capacity, improved sleep, and better body composition. Everyone should speak with their doctor before starting or changing an exercise routine.  Biomechanical Limitations Clinical staff conducted group or individual video education with verbal and written material and guidebook.  Patient learns how biomechanical limitations can impact exercise and how we can mitigate and possibly overcome limitations to have an impactful and balanced exercise routine.  Body Composition Clinical staff conducted group or individual video education with verbal and written material and guidebook.  Patient learns that body composition (ratio of muscle mass to fat mass) is a key component to assessing overall fitness, rather than body weight alone. Increased fat mass, especially visceral belly fat, can put us  at increased risk for metabolic syndrome, type 2 diabetes, heart disease, and even death. It is recommended to combine diet and exercise (cardiovascular and resistance training) to improve your body composition. Seek guidance from your physician and exercise physiologist before implementing an exercise routine.  Exercise Action Plan Clinical staff conducted group or individual video education with verbal and written material and guidebook.  Patient learns the recommended strategies to achieve and enjoy long-term exercise adherence, including variety, self-motivation, self-efficacy, and  positive decision making. Benefits of exercise include fitness, good health, weight management, more energy, better sleep, less stress, and overall well-being.  Medical   Heart Disease Risk Reduction Clinical staff conducted group or individual video education with verbal and written material and guidebook.  Patient learns our heart is our most vital organ as it circulates  oxygen, nutrients, white blood cells, and hormones throughout the entire body, and carries waste away. Data supports a plant-based eating plan like the Pritikin Program for its effectiveness in slowing progression of and reversing heart disease. The video provides a number of recommendations to address heart disease.   Metabolic Syndrome and Belly Fat  Clinical staff conducted group or individual video education with verbal and written material and guidebook.  Patient learns what metabolic syndrome is, how it leads to heart disease, and how one can reverse it and keep it from coming back. You have metabolic syndrome if you have 3 of the following 5 criteria: abdominal obesity, high blood pressure, high triglycerides, low HDL cholesterol, and high blood sugar.  Hypertension and Heart Disease Clinical staff conducted group or individual video education with verbal and written material and guidebook.  Patient learns that high blood pressure, or hypertension, is very common in the [place] . Hypertension is largely due to excessive salt intake, but other important risk factors include being overweight, physical inactivity, drinking too much alcohol, smoking, and not eating enough potassium from fruits and vegetables. High blood pressure is a leading risk factor for heart attack, stroke, congestive heart failure, dementia, kidney failure, and premature death. Long-term effects of excessive salt intake include stiffening of the arteries and thickening of heart muscle and organ damage. Recommendations include ways to reduce hypertension and the risk of heart disease.  Diseases of Our Time - Focusing on Diabetes Clinical staff conducted group or individual video education with verbal and written material and guidebook.  Patient learns why the best way to stop diseases of our time is prevention, through food and other lifestyle changes. Medicine (such as prescription pills and surgeries) is often only a  Band-Aid on the problem, not a long-term solution. Most common diseases of our time include obesity, type 2 diabetes, hypertension, heart disease, and cancer. The Pritikin Program is recommended and has been proven to help reduce, reverse, and/or prevent the damaging effects of metabolic syndrome.  Nutrition   Overview of the Pritikin Eating Plan  Clinical staff conducted group or individual video education with verbal and written material and guidebook.  Patient learns about the Pritikin Eating Plan for disease risk reduction. The Pritikin Eating Plan emphasizes a wide variety of unrefined, minimally-processed carbohydrates, like fruits, vegetables, whole grains, and legumes. Go, Caution, and Stop food choices are explained. Plant-based and lean animal proteins are emphasized. Rationale provided for low sodium intake for blood pressure control, low added sugars for blood sugar stabilization, and low added fats and oils for coronary artery disease risk reduction and weight management.  Calorie Density  Clinical staff conducted group or individual video education with verbal and written material and guidebook.  Patient learns about calorie density and how it impacts the Pritikin Eating Plan. Knowing the characteristics of the food you choose will help you decide whether those foods will lead to weight gain or weight loss, and whether you want to consume more or less of them. Weight loss is usually a side effect of the Pritikin Eating Plan because of its focus on low calorie-dense foods.  Label Reading  Clinical staff conducted  group or individual video education with verbal and written material and guidebook.  Patient learns about the Pritikin recommended label reading guidelines and corresponding recommendations regarding calorie density, added sugars, sodium content, and whole grains.  Dining Out - Part 1  Clinical staff conducted group or individual video education with verbal and written material  and guidebook.  Patient learns that restaurant meals can be sabotaging because they can be so high in calories, fat, sodium, and/or sugar. Patient learns recommended strategies on how to positively address this and avoid unhealthy pitfalls.  Facts on Fats  Clinical staff conducted group or individual video education with verbal and written material and guidebook.  Patient learns that lifestyle modifications can be just as effective, if not more so, as many medications for lowering your risk of heart disease. A Pritikin lifestyle can help to reduce your risk of inflammation and atherosclerosis (cholesterol build-up, or plaque, in the artery walls). Lifestyle interventions such as dietary choices and physical activity address the cause of atherosclerosis. A review of the types of fats and their impact on blood cholesterol levels, along with dietary recommendations to reduce fat intake is also included.  Nutrition Action Plan  Clinical staff conducted group or individual video education with verbal and written material and guidebook.  Patient learns how to incorporate Pritikin recommendations into their lifestyle. Recommendations include planning and keeping personal health goals in mind as an important part of their success.  Healthy Mind-Set    Healthy Minds, Bodies, Hearts  Clinical staff conducted group or individual video education with verbal and written material and guidebook.  Patient learns how to identify when they are stressed. Video will discuss the impact of that stress, as well as the many benefits of stress management. Patient will also be introduced to stress management techniques. The way we think, act, and feel has an impact on our hearts.  How Our Thoughts Can Heal Our Hearts  Clinical staff conducted group or individual video education with verbal and written material and guidebook.  Patient learns that negative thoughts can cause depression and anxiety. This can result in negative  lifestyle behavior and serious health problems. Cognitive behavioral therapy is an effective method to help control our thoughts in order to change and improve our emotional outlook.  Additional Videos:  Exercise    Improving Performance  Clinical staff conducted group or individual video education with verbal and written material and guidebook.  Patient learns to use a non-linear approach by alternating intensity levels and lengths of time spent exercising to help burn more calories and lose more body fat. Cardiovascular exercise helps improve heart health, metabolism, hormonal balance, blood sugar control, and recovery from fatigue. Resistance training improves strength, endurance, balance, coordination, reaction time, metabolism, and muscle mass. Flexibility exercise improves circulation, posture, and balance. Seek guidance from your physician and exercise physiologist before implementing an exercise routine and learn your capabilities and proper form for all exercise.  Introduction to Yoga  Clinical staff conducted group or individual video education with verbal and written material and guidebook.  Patient learns about yoga, a discipline of the coming together of mind, breath, and body. The benefits of yoga include improved flexibility, improved range of motion, better posture and core strength, increased lung function, weight loss, and positive self-image. Yogas heart health benefits include lowered blood pressure, healthier heart rate, decreased cholesterol and triglyceride levels, improved immune function, and reduced stress. Seek guidance from your physician and exercise physiologist before implementing an exercise routine and learn your  capabilities and proper form for all exercise.  Medical   Aging: Enhancing Your Quality of Life  Clinical staff conducted group or individual video education with verbal and written material and guidebook.  Patient learns key strategies and recommendations  to stay in good physical health and enhance quality of life, such as prevention strategies, having an advocate, securing a Health Care Proxy and Power of Attorney, and keeping a list of medications and system for tracking them. It also discusses how to avoid risk for bone loss.  Biology of Weight Control  Clinical staff conducted group or individual video education with verbal and written material and guidebook.  Patient learns that weight gain occurs because we consume more calories than we burn (eating more, moving less). Even if your body weight is normal, you may have higher ratios of fat compared to muscle mass. Too much body fat puts you at increased risk for cardiovascular disease, heart attack, stroke, type 2 diabetes, and obesity-related cancers. In addition to exercise, following the Pritikin Eating Plan can help reduce your risk.  Decoding Lab Results  Clinical staff conducted group or individual video education with verbal and written material and guidebook.  Patient learns that lab test reflects one measurement whose values change over time and are influenced by many factors, including medication, stress, sleep, exercise, food, hydration, pre-existing medical conditions, and more. It is recommended to use the knowledge from this video to become more involved with your lab results and evaluate your numbers to speak with your doctor.   Diseases of Our Time - Overview  Clinical staff conducted group or individual video education with verbal and written material and guidebook.  Patient learns that according to the CDC, 50% to 70% of chronic diseases (such as obesity, type 2 diabetes, elevated lipids, hypertension, and heart disease) are avoidable through lifestyle improvements including healthier food choices, listening to satiety cues, and increased physical activity.  Sleep Disorders Clinical staff conducted group or individual video education with verbal and written material and  guidebook.  Patient learns how good quality and duration of sleep are important to overall health and well-being. Patient also learns about sleep disorders and how they impact health along with recommendations to address them, including discussing with a physician.  Nutrition  Dining Out - Part 2 Clinical staff conducted group or individual video education with verbal and written material and guidebook.  Patient learns how to plan ahead and communicate in order to maximize their dining experience in a healthy and nutritious manner. Included are recommended food choices based on the type of restaurant the patient is visiting.   Fueling a Banker conducted group or individual video education with verbal and written material and guidebook.  There is a strong connection between our food choices and our health. Diseases like obesity and type 2 diabetes are very prevalent and are in large-part due to lifestyle choices. The Pritikin Eating Plan provides plenty of food and hunger-curbing satisfaction. It is easy to follow, affordable, and helps reduce health risks.  Menu Workshop  Clinical staff conducted group or individual video education with verbal and written material and guidebook.  Patient learns that restaurant meals can sabotage health goals because they are often packed with calories, fat, sodium, and sugar. Recommendations include strategies to plan ahead and to communicate with the manager, chef, or server to help order a healthier meal.  Planning Your Eating Strategy  Clinical staff conducted group or individual video education with verbal and written  material and guidebook.  Patient learns about the Pritikin Eating Plan and its benefit of reducing the risk of disease. The Pritikin Eating Plan does not focus on calories. Instead, it emphasizes high-quality, nutrient-rich foods. By knowing the characteristics of the foods, we choose, we can determine their calorie density  and make informed decisions.  Targeting Your Nutrition Priorities  Clinical staff conducted group or individual video education with verbal and written material and guidebook.  Patient learns that lifestyle habits have a tremendous impact on disease risk and progression. This video provides eating and physical activity recommendations based on your personal health goals, such as reducing LDL cholesterol, losing weight, preventing or controlling type 2 diabetes, and reducing high blood pressure.  Vitamins and Minerals  Clinical staff conducted group or individual video education with verbal and written material and guidebook.  Patient learns different ways to obtain key vitamins and minerals, including through a recommended healthy diet. It is important to discuss all supplements you take with your doctor.   Healthy Mind-Set    Smoking Cessation  Clinical staff conducted group or individual video education with verbal and written material and guidebook.  Patient learns that cigarette smoking and tobacco addiction pose a serious health risk which affects millions of people. Stopping smoking will significantly reduce the risk of heart disease, lung disease, and many forms of cancer. Recommended strategies for quitting are covered, including working with your doctor to develop a successful plan.  Culinary   Becoming a Set Designer conducted group or individual video education with verbal and written material and guidebook.  Patient learns that cooking at home can be healthy, cost-effective, quick, and puts them in control. Keys to cooking healthy recipes will include looking at your recipe, assessing your equipment needs, planning ahead, making it simple, choosing cost-effective seasonal ingredients, and limiting the use of added fats, salts, and sugars.  Cooking - Breakfast and Snacks  Clinical staff conducted group or individual video education with verbal and written material  and guidebook.  Patient learns how important breakfast is to satiety and nutrition through the entire day. Recommendations include key foods to eat during breakfast to help stabilize blood sugar levels and to prevent overeating at meals later in the day. Planning ahead is also a key component.  Cooking - Educational Psychologist conducted group or individual video education with verbal and written material and guidebook.  Patient learns eating strategies to improve overall health, including an approach to cook more at home. Recommendations include thinking of animal protein as a side on your plate rather than center stage and focusing instead on lower calorie dense options like vegetables, fruits, whole grains, and plant-based proteins, such as beans. Making sauces in large quantities to freeze for later and leaving the skin on your vegetables are also recommended to maximize your experience.  Cooking - Healthy Salads and Dressing Clinical staff conducted group or individual video education with verbal and written material and guidebook.  Patient learns that vegetables, fruits, whole grains, and legumes are the foundations of the Pritikin Eating Plan. Recommendations include how to incorporate each of these in flavorful and healthy salads, and how to create homemade salad dressings. Proper handling of ingredients is also covered. Cooking - Soups and [employer] - Soups and Desserts Clinical staff conducted group or individual video education with verbal and written material and guidebook.  Patient learns that Pritikin soups and desserts make for easy, nutritious, and delicious snacks and meal  components that are low in sodium, fat, sugar, and calorie density, while high in vitamins, minerals, and filling fiber. Recommendations include simple and healthy ideas for soups and desserts.   Overview     The Pritikin Solution Program Overview Clinical staff conducted group or individual video  education with verbal and written material and guidebook.  Patient learns that the results of the Pritikin Program have been documented in more than 100 articles published in peer-reviewed journals, and the benefits include reducing risk factors for (and, in some cases, even reversing) high cholesterol, high blood pressure, type 2 diabetes, obesity, and more! An overview of the three key pillars of the Pritikin Program will be covered: eating well, doing regular exercise, and having a healthy mind-set.  WORKSHOPS  Exercise: Exercise Basics: Building Your Action Plan Clinical staff led group instruction and group discussion with PowerPoint presentation and patient guidebook. To enhance the learning environment the use of posters, models and videos may be added. At the conclusion of this workshop, patients will comprehend the difference between physical activity and exercise, as well as the benefits of incorporating both, into their routine. Patients will understand the FITT (Frequency, Intensity, Time, and Type) principle and how to use it to build an exercise action plan. In addition, safety concerns and other considerations for exercise and cardiac rehab will be addressed by the presenter. The purpose of this lesson is to promote a comprehensive and effective weekly exercise routine in order to improve patients overall level of fitness.   Managing Heart Disease: Your Path to a Healthier Heart Clinical staff led group instruction and group discussion with PowerPoint presentation and patient guidebook. To enhance the learning environment the use of posters, models and videos may be added.At the conclusion of this workshop, patients will understand the anatomy and physiology of the heart. Additionally, they will understand how Pritikins three pillars impact the risk factors, the progression, and the management of heart disease.  The purpose of this lesson is to provide a high-level overview of the  heart, heart disease, and how the Pritikin lifestyle positively impacts risk factors.  Exercise Biomechanics Clinical staff led group instruction and group discussion with PowerPoint presentation and patient guidebook. To enhance the learning environment the use of posters, models and videos may be added. Patients will learn how the structural parts of their bodies function and how these functions impact their daily activities, movement, and exercise. Patients will learn how to promote a neutral spine, learn how to manage pain, and identify ways to improve their physical movement in order to promote healthy living. The purpose of this lesson is to expose patients to common physical limitations that impact physical activity. Participants will learn practical ways to adapt and manage aches and pains, and to minimize their effect on regular exercise. Patients will learn how to maintain good posture while sitting, walking, and lifting.  Balance Training and Fall Prevention  Clinical staff led group instruction and group discussion with PowerPoint presentation and patient guidebook. To enhance the learning environment the use of posters, models and videos may be added. At the conclusion of this workshop, patients will understand the importance of their sensorimotor skills (vision, proprioception, and the vestibular system) in maintaining their ability to balance as they age. Patients will apply a variety of balancing exercises that are appropriate for their current level of function. Patients will understand the common causes for poor balance, possible solutions to these problems, and ways to modify their physical environment in order to minimize  their fall risk. The purpose of this lesson is to teach patients about the importance of maintaining balance as they age and ways to minimize their risk of falling.  WORKSHOPS   Nutrition:  Fueling a Ship Broker led group instruction and  group discussion with PowerPoint presentation and patient guidebook. To enhance the learning environment the use of posters, models and videos may be added. Patients will review the foundational principles of the Pritikin Eating Plan and understand what constitutes a serving size in each of the food groups. Patients will also learn Pritikin-friendly foods that are better choices when away from home and review make-ahead meal and snack options. Calorie density will be reviewed and applied to three nutrition priorities: weight maintenance, weight loss, and weight gain. The purpose of this lesson is to reinforce (in a group setting) the key concepts around what patients are recommended to eat and how to apply these guidelines when away from home by planning and selecting Pritikin-friendly options. Patients will understand how calorie density may be adjusted for different weight management goals.  Mindful Eating  Clinical staff led group instruction and group discussion with PowerPoint presentation and patient guidebook. To enhance the learning environment the use of posters, models and videos may be added. Patients will briefly review the concepts of the Pritikin Eating Plan and the importance of low-calorie dense foods. The concept of mindful eating will be introduced as well as the importance of paying attention to internal hunger signals. Triggers for non-hunger eating and techniques for dealing with triggers will be explored. The purpose of this lesson is to provide patients with the opportunity to review the basic principles of the Pritikin Eating Plan, discuss the value of eating mindfully and how to measure internal cues of hunger and fullness using the Hunger Scale. Patients will also discuss reasons for non-hunger eating and learn strategies to use for controlling emotional eating.  Targeting Your Nutrition Priorities Clinical staff led group instruction and group discussion with PowerPoint presentation  and patient guidebook. To enhance the learning environment the use of posters, models and videos may be added. Patients will learn how to determine their genetic susceptibility to disease by reviewing their family history. Patients will gain insight into the importance of diet as part of an overall healthy lifestyle in mitigating the impact of genetics and other environmental insults. The purpose of this lesson is to provide patients with the opportunity to assess their personal nutrition priorities by looking at their family history, their own health history and current risk factors. Patients will also be able to discuss ways of prioritizing and modifying the Pritikin Eating Plan for their highest risk areas  Menu  Clinical staff led group instruction and group discussion with PowerPoint presentation and patient guidebook. To enhance the learning environment the use of posters, models and videos may be added. Using menus brought in from [employer], or printed from [employer], patients will apply the Pritikin dining out guidelines that were presented in the [employer] video. Patients will also be able to practice these guidelines in a variety of provided scenarios. The purpose of this lesson is to provide patients with the opportunity to practice hands-on learning of the Pritikin Dining Out guidelines with actual menus and practice scenarios.  Label Reading Clinical staff led group instruction and group discussion with PowerPoint presentation and patient guidebook. To enhance the learning environment the use of posters, models and videos may be added. Patients will review and discuss the  Pritikin label reading guidelines presented in Pritikins Label Reading Educational series video. Using fool labels brought in from local grocery stores and markets, patients will apply the label reading guidelines and determine if the packaged food meet the Pritikin guidelines. The purpose of  this lesson is to provide patients with the opportunity to review, discuss, and practice hands-on learning of the Pritikin Label Reading guidelines with actual packaged food labels. Cooking School  Pritikins [employer] are designed to teach patients ways to prepare quick, simple, and affordable recipes at home. The importance of nutritions role in chronic disease risk reduction is reflected in its emphasis in the overall Pritikin program. By learning how to prepare essential core Pritikin Eating Plan recipes, patients will increase control over what they eat; be able to customize the flavor of foods without the use of added salt, sugar, or fat; and improve the quality of the food they consume. By learning a set of core recipes which are easily assembled, quickly prepared, and affordable, patients are more likely to prepare more healthy foods at home. These workshops focus on convenient breakfasts, simple entres, side dishes, and desserts which can be prepared with minimal effort and are consistent with nutrition recommendations for cardiovascular risk reduction. Cooking [employer] are taught by a armed forces logistics/support/administrative officer (RD) who has been trained by the [employer]. The chef or RD has a clear understanding of the importance of minimizing - if not completely eliminating - added fat, sugar, and sodium in recipes. Throughout the series of Cooking School Workshop sessions, patients will learn about healthy ingredients and efficient methods of cooking to build confidence in their capability to prepare    Cooking School weekly topics:  Adding Flavor- Sodium-Free  Fast and Healthy Breakfasts  Powerhouse Plant-Based Proteins  Satisfying Salads and Dressings  Simple Sides and Sauces  International Cuisine-Spotlight on the [employer] Zones  Delicious Desserts  Savory Soups  [employer] - Meals in a Astronomer Appetizers and Snacks  Comforting Weekend  Breakfasts  One-Pot Wonders   Fast Evening Meals  Landscape Architect Your Pritikin Plate  WORKSHOPS   Healthy Mindset (Psychosocial):  Focused Goals, Sustainable Changes Clinical staff led group instruction and group discussion with PowerPoint presentation and patient guidebook. To enhance the learning environment the use of posters, models and videos may be added. Patients will be able to apply effective goal setting strategies to establish at least one personal goal, and then take consistent, meaningful action toward that goal. They will learn to identify common barriers to achieving personal goals and develop strategies to overcome them. Patients will also gain an understanding of how our mind-set can impact our ability to achieve goals and the importance of cultivating a positive and growth-oriented mind-set. The purpose of this lesson is to provide patients with a deeper understanding of how to set and achieve personal goals, as well as the tools and strategies needed to overcome common obstacles which may arise along the way.  From Head to Heart: The Power of a Healthy Outlook  Clinical staff led group instruction and group discussion with PowerPoint presentation and patient guidebook. To enhance the learning environment the use of posters, models and videos may be added. Patients will be able to recognize and describe the impact of emotions and mood on physical health. They will discover the importance of self-care and explore self-care practices which may work for them. Patients will also learn how to utilize the 4 Cs to  cultivate a healthier outlook and better manage stress and challenges. The purpose of this lesson is to demonstrate to patients how a healthy outlook is an essential part of maintaining good health, especially as they continue their cardiac rehab journey.  Healthy Sleep for a Healthy Heart Clinical staff led group instruction and group discussion with  PowerPoint presentation and patient guidebook. To enhance the learning environment the use of posters, models and videos may be added. At the conclusion of this workshop, patients will be able to demonstrate knowledge of the importance of sleep to overall health, well-being, and quality of life. They will understand the symptoms of, and treatments for, common sleep disorders. Patients will also be able to identify daytime and nighttime behaviors which impact sleep, and they will be able to apply these tools to help manage sleep-related challenges. The purpose of this lesson is to provide patients with a general overview of sleep and outline the importance of quality sleep. Patients will learn about a few of the most common sleep disorders. Patients will also be introduced to the concept of sleep hygiene, and discover ways to self-manage certain sleeping problems through simple daily behavior changes. Finally, the workshop will motivate patients by clarifying the links between quality sleep and their goals of heart-healthy living.   Recognizing and Reducing Stress Clinical staff led group instruction and group discussion with PowerPoint presentation and patient guidebook. To enhance the learning environment the use of posters, models and videos may be added. At the conclusion of this workshop, patients will be able to understand the types of stress reactions, differentiate between acute and chronic stress, and recognize the impact that chronic stress has on their health. They will also be able to apply different coping mechanisms, such as reframing negative self-talk. Patients will have the opportunity to practice a variety of stress management techniques, such as deep abdominal breathing, progressive muscle relaxation, and/or guided imagery.  The purpose of this lesson is to educate patients on the role of stress in their lives and to provide healthy techniques for coping with it.  Learning  Barriers/Preferences:  Learning Barriers/Preferences - 12/09/24 1016       Learning Barriers/Preferences   Learning Barriers None    Learning Preferences Written Material          Education Topics:  Knowledge Questionnaire Score:  Knowledge Questionnaire Score - 12/09/24 1017       Knowledge Questionnaire Score   Pre Score 23/24          Core Components/Risk Factors/Patient Goals at Admission:  Personal Goals and Risk Factors at Admission - 12/09/24 0903       Core Components/Risk Factors/Patient Goals on Admission   Diabetes Yes    Intervention Provide education about signs/symptoms and action to take for hypo/hyperglycemia.;Provide education about proper nutrition, including hydration, and aerobic/resistive exercise prescription along with prescribed medications to achieve blood glucose in normal ranges: Fasting glucose 65-99 mg/dL    Expected Outcomes Short Term: Participant verbalizes understanding of the signs/symptoms and immediate care of hyper/hypoglycemia, proper foot care and importance of medication, aerobic/resistive exercise and nutrition plan for blood glucose control.;Long Term: Attainment of HbA1C < 7%.    Hypertension Yes    Intervention Provide education on lifestyle modifcations including regular physical activity/exercise, weight management, moderate sodium restriction and increased consumption of fresh fruit, vegetables, and low fat dairy, alcohol moderation, and smoking cessation.;Monitor prescription use compliance.    Expected Outcomes Short Term: Continued assessment and intervention until BP is < 140/41mm  HG in hypertensive participants. < 130/37mm HG in hypertensive participants with diabetes, heart failure or chronic kidney disease.;Long Term: Maintenance of blood pressure at goal levels.    Lipids Yes    Intervention Provide education and support for participant on nutrition & aerobic/resistive exercise along with prescribed medications to achieve LDL  70mg , HDL >40mg .    Expected Outcomes Short Term: Participant states understanding of desired cholesterol values and is compliant with medications prescribed. Participant is following exercise prescription and nutrition guidelines.;Long Term: Cholesterol controlled with medications as prescribed, with individualized exercise RX and with personalized nutrition plan. Value goals: LDL < 70mg , HDL > 40 mg.    Personal Goal Other Yes    Intervention Will continue to monitor pt and progress workloads as tolerated without sign or symptom    Expected Outcomes Pt will achieve his goals and increase strength          Core Components/Risk Factors/Patient Goals Review:    Core Components/Risk Factors/Patient Goals at Discharge (Final Review):    ITP Comments:  ITP Comments     Row Name 12/09/24 0849           ITP Comments Wilbert Bihari, MD: Medical Director. Introduction to the [facility] / Intensive Cardiac Rehab. Initial orientation packet reviewed with the patient.          Comments: Participant attended orientation for the cardiac rehabilitation program on  12/09/2024  to perform initial intake and exercise walk test. Patient introduced to the Pritikin Program education and orientation packet was reviewed. Completed 6-minute walk test, measurements, initial ITP, and exercise prescription. Vital signs stable. Telemetry-normal sinus rhythm, asymptomatic.   Service time was from 8:07 to 9:53.        [1]  Current Outpatient Medications:    aspirin 81 MG chewable tablet, Chew 81 mg by mouth daily., Disp: , Rfl:    atorvastatin (LIPITOR) 80 MG tablet, Take 80 mg by mouth daily., Disp: , Rfl:    Cholecalciferol 50 MCG (2000 UT) CAPS, Take 1 capsule by mouth every morning., Disp: , Rfl:    ibuprofen (ADVIL) 200 MG tablet, Take 200 mg by mouth every 6 (six) hours as needed for moderate pain (pain score 4-6)., Disp: , Rfl:    Insulin Disposable Pump (OMNIPOD DASH PODS, GEN 4,)  MISC, Inject 1 Application into the skin every Monday, Wednesday, and Friday., Disp: , Rfl:    LYUMJEV 100 UNIT/ML SOLN, Inject 100 Units/100 mL into the skin as directed., Disp: , Rfl:    metoprolol succinate (TOPROL-XL) 25 MG 24 hr tablet, Take 12.5 mg by mouth daily., Disp: , Rfl:    ramipril (ALTACE) 10 MG capsule, Take 10 mg by mouth daily. (Patient taking differently: Take 10 mg by mouth daily. Taking 20 mg once a day), Disp: , Rfl:    ticagrelor (BRILINTA) 90 MG TABS tablet, Take 90 mg by mouth 2 (two) times daily., Disp: , Rfl:    TURMERIC PO, Take 1 tablet by mouth every morning., Disp: , Rfl:  [2]  Social History Tobacco Use  Smoking Status Never  Smokeless Tobacco Never

## 2024-12-13 ENCOUNTER — Telehealth (HOSPITAL_COMMUNITY): Payer: Self-pay

## 2024-12-13 NOTE — Telephone Encounter (Signed)
 Correspondence received from [facility].  Scanned to Media

## 2024-12-15 ENCOUNTER — Encounter (HOSPITAL_COMMUNITY)
Admission: RE | Admit: 2024-12-15 | Discharge: 2024-12-15 | Disposition: A | Source: Ambulatory Visit | Attending: Cardiology

## 2024-12-15 ENCOUNTER — Ambulatory Visit: Payer: Self-pay | Admitting: Cardiology

## 2024-12-15 DIAGNOSIS — I2102 ST elevation (STEMI) myocardial infarction involving left anterior descending coronary artery: Secondary | ICD-10-CM

## 2024-12-15 DIAGNOSIS — Z955 Presence of coronary angioplasty implant and graft: Secondary | ICD-10-CM

## 2024-12-15 DIAGNOSIS — Z48812 Encounter for surgical aftercare following surgery on the circulatory system: Secondary | ICD-10-CM | POA: Diagnosis not present

## 2024-12-15 LAB — GLUCOSE, CAPILLARY
Glucose-Capillary: 199 mg/dL — ABNORMAL HIGH (ref 70–99)
Glucose-Capillary: 187 mg/dL — ABNORMAL HIGH (ref 70–99)

## 2024-12-15 NOTE — Progress Notes (Signed)
 Daily Session Note  Patient Details  Name: Anthony Townsend MRN: [id number] Date of Birth: [birth year] Referring Provider:   Flowsheet Row INTENSIVE CARDIAC REHAB ORIENT from 12/09/2024 in [facility]  Referring Provider Dr. Leonard (Dr. Wilbert Bihari MD)    Encounter Date: 12/15/2024  Check In:  Session Check In - 12/15/24 0819       Check-In   Supervising physician immediately available to respond to emergencies [facility] MD immediately available    Physician(s) Barnie Hila, NP    Location [facility]    Staff Present Alm Parkins, MS, ACSM-CEP, CCRP, Exercise Physiologist;Ruthmary Occhipinti, RN, Avonne Gal, MS, ACSM-CEP, Exercise Physiologist;Joseph Lennon, RN, BSN;Jetta Walker BS, ACSM-CEP, Exercise Physiologist    Virtual Visit No    Medication changes reported     No    Fall or balance concerns reported    No    Tobacco Cessation No Change    Warm-up and Cool-down Performed as group-led instruction    Resistance Training Performed No    VAD Patient? No    PAD/SET Patient? No      Pain Assessment   Currently in Pain? No/denies    Pain Score 0-No pain    Multiple Pain Sites No          Capillary Blood Glucose: Results for orders placed or performed during the hospital encounter of 12/15/24 (from the past 24 hours)  Glucose, capillary     Status: Abnormal   Collection Time: 12/15/24  9:00 AM  Result Value Ref Range   Glucose-Capillary 199 (H) 70 - 99 mg/dL  Glucose, capillary     Status: Abnormal   Collection Time: 12/15/24  9:59 AM  Result Value Ref Range   Glucose-Capillary 187 (H) 70 - 99 mg/dL     Exercise Prescription Changes - 12/15/24 1400       Response to Exercise   Blood Pressure (Admit) 142/78    Blood Pressure (Exercise) 150/80    Blood Pressure (Exit) 108/58    Heart Rate (Admit) 72 bpm    Heart Rate (Exercise) 137 bpm    Heart Rate (Exit) 72 bpm    Rating of  Perceived Exertion (Exercise) 11    Symptoms None    Comments Pt's first day in the [facility]2 program    Duration Continue with 30 min of aerobic exercise without signs/symptoms of physical distress.    Intensity THRR unchanged      Progression   Progression Continue to progress workloads to maintain intensity without signs/symptoms of physical distress.    Average METs 4.2      Resistance Training   Weight No weights on Wednesdays      Interval Training   Interval Training Yes    Equipment Treadmill    Comments HIIT      Treadmill   MPH 5    Grade 0    Minutes 15    METs 8.66      Arm Ergometer   Level 2    Watts 29    RPM 78    Minutes 15    METs 2.9          Tobacco Use History[1]  Goals Met:  Exercise tolerated well No report of concerns or symptoms today  Goals Unmet:  Not Applicable  Comments: Anthony Townsend started cardiac rehab today.  Pt tolerated light exercise without difficulty. VSS, telemetry-Sinus Rhythm, asymptomatic.  Medication list reconciled. Pt denies barriers  to medicaiton compliance.  PSYCHOSOCIAL ASSESSMENT:  PHQ-2. Pt exhibits positive coping skills, hopeful outlook with supportive family. No psychosocial needs identified at this time, no psychosocial interventions necessary.    Pt enjoys exercise, hiking, biking, sail boating and writing.   Pt oriented to exercise equipment and routine.    Understanding verbalized.Anthony Townsend Anthony Quan RN BSN    Dr. Wilbert Bihari is Medical Director for Cardiac Rehab at [facility].     [1]  Social History Tobacco Use  Smoking Status Never  Smokeless Tobacco Never

## 2024-12-15 NOTE — Progress Notes (Signed)
 Cardiac Individual Treatment Plan  Patient Details  Name: Anthony Townsend MRN: [id number] Date of Birth: July 13, 1966 Referring Provider:   Flowsheet Row INTENSIVE CARDIAC REHAB ORIENT from 12/09/2024 in [facility]  Referring Provider Dr. Leonard (Dr. 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   TCO2 0 - 100 mmol/L 27  -    Capillary Blood Glucose: Lab Results  Component Value Date   GLUCAP 187 (H) 12/15/2024   GLUCAP 199 (H) 12/15/2024   GLUCAP 249 (H) 11/13/2008   GLUCAP 137 (H) 11/12/2008   GLUCAP 151 (H) 11/12/2008     Exercise Target Goals: Exercise Program Goal: Individual exercise prescription set using results from initial 6 min walk test and THRR while considering  patients activity barriers and safety.   Exercise Prescription Goal: Initial exercise prescription builds to 30-45 minutes a day of  aerobic activity, 2-3 days per week.  Home exercise guidelines will be given to patient during program as part of exercise prescription that the participant will acknowledge.  Activity Barriers & Risk Stratification:  Activity Barriers & Cardiac Risk Stratification - 12/09/24 1019       Activity Barriers & Cardiac Risk Stratification   Activity Barriers Other (comment)    Comments Pt does have h/o vertigo    Cardiac Risk Stratification High          6 Minute Walk:  6 Minute Walk     Row Name 12/09/24 1018         6 Minute Walk   Phase Initial     Distance 1450 feet     Walk Time 6 minutes     # of Rest Breaks 0     MPH 2.75     METS 3.85     RPE 6     Perceived Dyspnea  0     VO2 Peak 13.5     Symptoms No     Resting HR 65 bpm     Resting BP 132/70     Resting Oxygen Saturation  100 %     Exercise Oxygen Saturation  during 6 min walk 100 %     Max Ex. HR 86 bpm     Max Ex. BP 142/90     2 Minute Post BP 150/82        Oxygen Initial  Assessment:   Oxygen Re-Evaluation:   Oxygen Discharge (Final Oxygen Re-Evaluation):   Initial Exercise Prescription:  Initial Exercise Prescription - 12/09/24 0900       Date of Initial Exercise RX and Referring Provider   Date 12/09/24    Referring Provider Dr. Leonard (Dr. Wilbert Bihari MD)    Expected Discharge Date 03/04/24      Treadmill   MPH 3.7   HITT, Jog at 5.0 mph   Grade 0    Minutes 15    METs 3.85      Arm Ergometer   Level 1.5    Watts 50    RPM 60    Minutes 15    METs 3.85      Prescription Details   Frequency (times per week) 3    Duration Progress to 30 minutes of continuous aerobic without signs/symptoms of physical distress      Intensity   THRR 40-80% of Max Heartrate 63-126    Ratings of Perceived Exertion 11-13    Perceived Dyspnea 0-4      Progression   Progression Continue progressive overload as per policy without signs/symptoms or physical distress.      Resistance Training    Training Prescription Yes    Weight 5 lbs    Reps 10-15          Perform Capillary Blood Glucose checks as needed.  Exercise Prescription Changes:   Exercise Prescription Changes     Row Name 12/15/24 1400             Response to Exercise   Blood Pressure (Admit) 142/78       Blood Pressure (Exercise) 150/80       Blood Pressure (Exit) 108/58       Heart Rate (Admit) 72 bpm       Heart Rate (Exercise) 137 bpm       Heart Rate (Exit) 72 bpm       Rating of Perceived Exertion (Exercise) 11       Symptoms None       Comments Pt's first day in the CRP2 program       Duration Continue with 30 min of aerobic exercise without signs/symptoms of physical distress.       Intensity THRR unchanged         Progression   Progression Continue to progress workloads to maintain intensity without signs/symptoms of physical distress.       Average METs 4.2         Resistance Training   Weight No weights on Wednesdays         Interval Training   Interval Training Yes       Equipment Treadmill       Comments HIIT         Treadmill   MPH 5       Grade 0       Minutes 15       METs 8.66         Arm Ergometer   Level 2       Watts 29       RPM 78       Minutes 15       METs 2.9          Exercise Comments:   Exercise Comments     Row Name 12/15/24 1418           Exercise Comments Pt's first day in the CRP2 program. Pt  exercised without complaints. Pt felt that 3.7 was too fast to walk at and speed on the treadmill was reduced to 3.3. Pt did HIIT on the treadmill, walking for 2 minutes @ 3.3 mph and jogging 5.0 mph.          Exercise Goals and Review:   Exercise Goals     Row Name 12/09/24 0902             Exercise Goals   Increase Physical Activity Yes       Intervention Provide advice, education, support and counseling about physical activity/exercise needs.;Develop an individualized exercise prescription for aerobic and resistive training based on  initial evaluation findings, risk stratification, comorbidities and participant's personal goals.       Expected Outcomes Short Term: Attend rehab on a regular basis to increase amount of physical activity.;Long Term: Exercising regularly at least 3-5 days a week.;Long Term: Add in home exercise to make exercise part of routine and to increase amount of physical activity.       Increase Strength and Stamina Yes       Intervention Provide advice, education, support and counseling about physical activity/exercise needs.;Develop an individualized exercise prescription for aerobic and resistive training based on initial evaluation findings, risk stratification, comorbidities and participant's personal goals.       Expected Outcomes Short Term: Increase workloads from initial exercise prescription for resistance, speed, and METs.;Short Term: Perform resistance training exercises routinely during rehab and add in resistance training at home;Long Term: Improve cardiorespiratory fitness, muscular endurance and strength as measured by increased METs and functional capacity ( )       Able to understand and use rate of perceived exertion (RPE) scale Yes       Intervention Provide education and explanation on how to use RPE scale       Expected Outcomes Short Term: Able to use RPE daily in rehab to express subjective intensity level;Long Term:  Able to use RPE to guide intensity level when exercising independently       Knowledge and understanding of Target Heart Rate Range (THRR) Yes       Intervention Provide education and explanation of THRR including how the numbers were predicted and where they are located for reference       Expected Outcomes Short Term: Able to state/look up THRR;Long Term: Able to use THRR to govern intensity when exercising independently;Short Term: Able to use daily as guideline for intensity in rehab       Understanding of Exercise Prescription Yes       Intervention Provide education,  explanation, and written materials on patient's individual exercise prescription       Expected Outcomes Short Term: Able to explain program exercise prescription;Long Term: Able to explain home exercise prescription to exercise independently          Exercise Goals Re-Evaluation :  Exercise Goals Re-Evaluation     Row Name 12/15/24 1417             Exercise Goal Re-Evaluation   Exercise Goals Review Increase Physical Activity;Understanding of Exercise Prescription;Increase Strength and Stamina;Knowledge and understanding of Target Heart Rate Range (THRR);Able to understand and use rate of perceived exertion (RPE) scale       Comments Pt's first day in the CRP2 program. Pt understands the exercise Rx, RPE scale and THRR.       Expected Outcomes Will continue to monitor the patient and progress exercise workloads as tolerated.  Discharge Exercise Prescription (Final Exercise Prescription Changes):  Exercise Prescription Changes - 12/15/24 1400       Response to Exercise   Blood Pressure (Admit) 142/78    Blood Pressure (Exercise) 150/80    Blood Pressure (Exit) 108/58    Heart Rate (Admit) 72 bpm    Heart Rate (Exercise) 137 bpm    Heart Rate (Exit) 72 bpm    Rating of Perceived Exertion (Exercise) 11    Symptoms None    Comments Pt's first day in the CRP2 program    Duration Continue with 30 min of aerobic exercise without signs/symptoms of physical distress.    Intensity THRR unchanged      Progression   Progression Continue to progress workloads to maintain intensity without signs/symptoms of physical distress.    Average METs 4.2      Resistance Training   Weight No weights on Wednesdays      Interval Training   Interval Training Yes    Equipment Treadmill    Comments HIIT      Treadmill   MPH 5    Grade 0    Minutes 15    METs 8.66      Arm Ergometer   Level 2    Watts 29    RPM 78    Minutes 15    METs 2.9          Nutrition:  Target  Goals: Understanding of nutrition guidelines, daily intake of sodium 1500mg , cholesterol 200mg , calories 30% from fat and 7% or less from saturated fats, daily to have 5 or more servings of fruits and vegetables.  Biometrics:  Pre Biometrics - 12/09/24 0830       Pre Biometrics   Waist Circumference 35.75 inches    Hip Circumference 39.75 inches    Waist to Hip Ratio 0.9 %    Triceps Skinfold 15 mm    % Body Fat 24.9 %    Grip Strength 36 kg    Flexibility 14.75 in    Single Leg Stand 30 seconds           Nutrition Therapy Plan and Nutrition Goals:  Nutrition Therapy & Goals - 12/15/24 1038       Nutrition Therapy   Diet Heart Healthy, Diabetic Diet    Drug/Food Interactions Statins/Certain Fruits      Personal Nutrition Goals   Nutrition Goal Patient to maintain diet quality by including lean protein/plant protein, fruits, vegetables, whole grains, and nonfat dairy as part of a well-balanced diet.    Comments Patient with medical history significant for CAD with recent PCI, AIVR/nonsustained VT, HTN, HLD, and diabetes mellitus type I. Elevated A1c of 8.7% on 10/29/24; pt working with endocrinologist. Blood glucose within appropriate range for exercise today. Pt states current diet aligns well with Pritikin guidelines; practices label reading given history of DM. Pt denies any questions/concerns regarding diet at this time. RD encouraged pt to continue current diet with focus on plant-based foods, lean sources of protein, and healthy fats.      Intervention Plan   Intervention Prescribe, educate and counsel regarding individualized specific dietary modifications aiming towards targeted core components such as weight, hypertension, lipid management, diabetes, heart failure and other comorbidities.    Expected Outcomes Short Term Goal: Understand basic principles of dietary content, such as calories, fat, sodium, cholesterol and nutrients.;Long Term Goal: Adherence to prescribed  nutrition plan.          Nutrition Assessments:  MEDIFICTS Score  Key: >=70 Need to make dietary changes  40-70 Heart Healthy Diet <= 40 Therapeutic Level Cholesterol Diet    Picture Your Plate Scores: <59 Unhealthy dietary pattern with much room for improvement. 41-50 Dietary pattern unlikely to meet recommendations for good health and room for improvement. 51-60 More healthful dietary pattern, with some room for improvement.  >60 Healthy dietary pattern, although there may be some specific behaviors that could be improved.    Nutrition Goals Re-Evaluation:   Nutrition Goals Re-Evaluation:   Nutrition Goals Discharge (Final Nutrition Goals Re-Evaluation):   Psychosocial: Target Goals: Acknowledge presence or absence of significant depression and/or stress, maximize coping skills, provide positive support system. Participant is able to verbalize types and ability to use techniques and skills needed for reducing stress and depression.  Initial Review & Psychosocial Screening:  Initial Psych Review & Screening - 12/09/24 0912       Initial Review   Current issues with Current Anxiety/Panic;Current Stress Concerns    Source of Stress Concerns Chronic Illness    Comments Anthony Townsend says having the MI caught him off gaurd as he is very active and is in good health      Family Dynamics   Good Support System? Yes   Anthony Townsend has his wife and firends for support.     Barriers   Psychosocial barriers to participate in program The patient should benefit from training in stress management and relaxation.      Screening Interventions   Interventions Encouraged to exercise;Provide feedback about the scores to participant    Expected Outcomes Long Term Goal: Stressors or current issues are controlled or eliminated.;Long Term goal: The participant improves quality of Life and PHQ9 Scores as seen by post scores and/or verbalization of changes;Short Term goal: Identification and review with  participant of any Quality of Life or Depression concerns found by scoring the questionnaire.          Quality of Life Scores:  Quality of Life - 12/09/24 1015       Quality of Life   Select Quality of Life      Quality of Life Scores   Health/Function Pre 27.73 %    Socioeconomic Pre 27.5 %    Psych/Spiritual Pre 27 %    Family Pre 27.6 %    GLOBAL Pre 27.53 %         Scores of 19 and below usually indicate a poorer quality of life in these areas.  A difference of  2-3 points is a clinically meaningful difference.  A difference of 2-3 points in the total score of the Quality of Life Index has been associated with significant improvement in overall quality of life, self-image, physical symptoms, and general health in studies assessing change in quality of life.  PHQ-9: Review Flowsheet       12/09/2024  Depression screen PHQ 2/9  Decreased Interest 0  Down, Depressed, Hopeless 0  PHQ - 2 Score 0  Altered sleeping 1  Tired, decreased energy 0  Change in appetite 0  Feeling bad or failure about yourself  1  Trouble concentrating 0  Moving slowly or fidgety/restless 0  Suicidal thoughts 0  PHQ-9 Score 2  Difficult doing work/chores Not difficult at all   Interpretation of Total Score  Total Score Depression Severity:  1-4 = Minimal depression, 5-9 = Mild depression, 10-14 = Moderate depression, 15-19 = Moderately severe depression, 20-27 = Severe depression   Psychosocial Evaluation and Intervention:   Psychosocial Re-Evaluation:  Psychosocial  Re-Evaluation     Row Name 12/15/24 1258             Psychosocial Re-Evaluation   Current issues with Current Stress Concerns;Current Anxiety/Panic       Comments Anthony Townsend did well with exercise. Anthony Townsend did not voice any increased concerns or stressors on his first day of exercise.       Expected Outcomes Anthony Townsend will continue to participate in cardiac rehab for exercise, nutrition and lifestyle modifications        Interventions Stress management education;Encouraged to attend Cardiac Rehabilitation for the exercise;Relaxation education       Continue Psychosocial Services  No Follow up required         Initial Review   Source of Stress Concerns Chronic Illness       Comments Will continue to monitor and offer support as needed          Psychosocial Discharge (Final Psychosocial Re-Evaluation):  Psychosocial Re-Evaluation - 12/15/24 1258       Psychosocial Re-Evaluation   Current issues with Current Stress Concerns;Current Anxiety/Panic    Comments Anthony Townsend did well with exercise. Anthony Townsend did not voice any increased concerns or stressors on his first day of exercise.    Expected Outcomes Denilson will continue to participate in cardiac rehab for exercise, nutrition and lifestyle modifications    Interventions Stress management education;Encouraged to attend Cardiac Rehabilitation for the exercise;Relaxation education    Continue Psychosocial Services  No Follow up required      Initial Review   Source of Stress Concerns Chronic Illness    Comments Will continue to monitor and offer support as needed          Vocational Rehabilitation: Provide vocational rehab assistance to qualifying candidates.   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 Anthony Townsend does not need vocational rehab at this time         Education: Education Goals: Education classes will be provided on a weekly basis, covering required topics. Participant will state understanding/return demonstration of topics presented.    Education     Row Name 12/15/24 0800     Education   Cardiac Education Topics Pritikin   Orthoptist   Educator Dietitian   Weekly Topic Fast and Healthy Breakfasts   Instruction Review  Code 1- Verbalizes Understanding   Class Start Time 0815   Class Stop Time [phone]   Class Time Calculation (min) 38 min      Core Videos: Exercise    Move It!  Clinical staff conducted group or individual video education with verbal and written material and guidebook.  Patient learns the recommended Pritikin exercise program. Exercise with the goal of living a long, healthy life. Some of the health benefits of exercise include controlled diabetes, healthier blood pressure levels, improved cholesterol levels, improved heart and lung capacity, improved sleep, and better body composition. Everyone should speak with their doctor before starting or changing an exercise routine.  Biomechanical Limitations Clinical staff conducted group or individual video education with verbal and written material and guidebook.  Patient learns how biomechanical limitations can impact exercise and how we can mitigate and possibly overcome limitations to have an impactful and balanced exercise routine.  Body Composition Clinical staff conducted group or individual video  education with verbal and written material and guidebook.  Patient learns that body composition (ratio of muscle mass to fat mass) is a key component to assessing overall fitness, rather than body weight alone. Increased fat mass, especially visceral belly fat, can put us  at increased risk for metabolic syndrome, type 2 diabetes, heart disease, and even death. It is recommended to combine diet and exercise (cardiovascular and resistance training) to improve your body composition. Seek guidance from your physician and exercise physiologist before implementing an exercise routine.  Exercise Action Plan Clinical staff conducted group or individual video education with verbal and written material and guidebook.  Patient learns the recommended strategies to achieve and enjoy long-term exercise adherence, including variety, self-motivation, self-efficacy, and  positive decision making. Benefits of exercise include fitness, good health, weight management, more energy, better sleep, less stress, and overall well-being.  Medical   Heart Disease Risk Reduction Clinical staff conducted group or individual video education with verbal and written material and guidebook.  Patient learns our heart is our most vital organ as it circulates oxygen, nutrients, white blood cells, and hormones throughout the entire body, and carries waste away. Data supports a plant-based eating plan like the Pritikin Program for its effectiveness in slowing progression of and reversing heart disease. The video provides a number of recommendations to address heart disease.   Metabolic Syndrome and Belly Fat  Clinical staff conducted group or individual video education with verbal and written material and guidebook.  Patient learns what metabolic syndrome is, how it leads to heart disease, and how one can reverse it and keep it from coming back. You have metabolic syndrome if you have 3 of the following 5 criteria: abdominal obesity, high blood pressure, high triglycerides, low HDL cholesterol, and high blood sugar.  Hypertension and Heart Disease Clinical staff conducted group or individual video education with verbal and written material and guidebook.  Patient learns that high blood pressure, or hypertension, is very common in the [place] . Hypertension is largely due to excessive salt intake, but other important risk factors include being overweight, physical inactivity, drinking too much alcohol, smoking, and not eating enough potassium from fruits and vegetables. High blood pressure is a leading risk factor for heart attack, stroke, congestive heart failure, dementia, kidney failure, and premature death. Long-term effects of excessive salt intake include stiffening of the arteries and thickening of heart muscle and organ damage. Recommendations include ways to reduce hypertension  and the risk of heart disease.  Diseases of Our Time - Focusing on Diabetes Clinical staff conducted group or individual video education with verbal and written material and guidebook.  Patient learns why the best way to stop diseases of our time is prevention, through food and other lifestyle changes. Medicine (such as prescription pills and surgeries) is often only a Band-Aid on the problem, not a long-term solution. Most common diseases of our time include obesity, type 2 diabetes, hypertension, heart disease, and cancer. The Pritikin Program is recommended and has been proven to help reduce, reverse, and/or prevent the damaging effects of metabolic syndrome.  Nutrition   Overview of the Pritikin Eating Plan  Clinical staff conducted group or individual video education with verbal and written material and guidebook.  Patient learns about the Pritikin Eating Plan for disease risk reduction. The Pritikin Eating Plan emphasizes a wide variety of unrefined, minimally-processed carbohydrates, like fruits, vegetables, whole grains, and legumes. Go, Caution, and Stop food choices are explained. Plant-based and lean animal proteins are emphasized. Rationale  provided for low sodium intake for blood pressure control, low added sugars for blood sugar stabilization, and low added fats and oils for coronary artery disease risk reduction and weight management.  Calorie Density  Clinical staff conducted group or individual video education with verbal and written material and guidebook.  Patient learns about calorie density and how it impacts the Pritikin Eating Plan. Knowing the characteristics of the food you choose will help you decide whether those foods will lead to weight gain or weight loss, and whether you want to consume more or less of them. Weight loss is usually a side effect of the Pritikin Eating Plan because of its focus on low calorie-dense foods.  Label Reading  Clinical staff conducted group or  individual video education with verbal and written material and guidebook.  Patient learns about the Pritikin recommended label reading guidelines and corresponding recommendations regarding calorie density, added sugars, sodium content, and whole grains.  Dining Out - Part 1  Clinical staff conducted group or individual video education with verbal and written material and guidebook.  Patient learns that restaurant meals can be sabotaging because they can be so high in calories, fat, sodium, and/or sugar. Patient learns recommended strategies on how to positively address this and avoid unhealthy pitfalls.  Facts on Fats  Clinical staff conducted group or individual video education with verbal and written material and guidebook.  Patient learns that lifestyle modifications can be just as effective, if not more so, as many medications for lowering your risk of heart disease. A Pritikin lifestyle can help to reduce your risk of inflammation and atherosclerosis (cholesterol build-up, or plaque, in the artery walls). Lifestyle interventions such as dietary choices and physical activity address the cause of atherosclerosis. A review of the types of fats and their impact on blood cholesterol levels, along with dietary recommendations to reduce fat intake is also included.  Nutrition Action Plan  Clinical staff conducted group or individual video education with verbal and written material and guidebook.  Patient learns how to incorporate Pritikin recommendations into their lifestyle. Recommendations include planning and keeping personal health goals in mind as an important part of their success.  Healthy Mind-Set    Healthy Minds, Bodies, Hearts  Clinical staff conducted group or individual video education with verbal and written material and guidebook.  Patient learns how to identify when they are stressed. Video will discuss the impact of that stress, as well as the many benefits of stress management.  Patient will also be introduced to stress management techniques. The way we think, act, and feel has an impact on our hearts.  How Our Thoughts Can Heal Our Hearts  Clinical staff conducted group or individual video education with verbal and written material and guidebook.  Patient learns that negative thoughts can cause depression and anxiety. This can result in negative lifestyle behavior and serious health problems. Cognitive behavioral therapy is an effective method to help control our thoughts in order to change and improve our emotional outlook.  Additional Videos:  Exercise    Improving Performance  Clinical staff conducted group or individual video education with verbal and written material and guidebook.  Patient learns to use a non-linear approach by alternating intensity levels and lengths of time spent exercising to help burn more calories and lose more body fat. Cardiovascular exercise helps improve heart health, metabolism, hormonal balance, blood sugar control, and recovery from fatigue. Resistance training improves strength, endurance, balance, coordination, reaction time, metabolism, and muscle mass. Flexibility exercise improves  circulation, posture, and balance. Seek guidance from your physician and exercise physiologist before implementing an exercise routine and learn your capabilities and proper form for all exercise.  Introduction to Yoga  Clinical staff conducted group or individual video education with verbal and written material and guidebook.  Patient learns about yoga, a discipline of the coming together of mind, breath, and body. The benefits of yoga include improved flexibility, improved range of motion, better posture and core strength, increased lung function, weight loss, and positive self-image. Yogas heart health benefits include lowered blood pressure, healthier heart rate, decreased cholesterol and triglyceride levels, improved immune function, and reduced stress.  Seek guidance from your physician and exercise physiologist before implementing an exercise routine and learn your capabilities and proper form for all exercise.  Medical   Aging: Enhancing Your Quality of Life  Clinical staff conducted group or individual video education with verbal and written material and guidebook.  Patient learns key strategies and recommendations to stay in good physical health and enhance quality of life, such as prevention strategies, having an advocate, securing a Health Care Proxy and Power of Attorney, and keeping a list of medications and system for tracking them. It also discusses how to avoid risk for bone loss.  Biology of Weight Control  Clinical staff conducted group or individual video education with verbal and written material and guidebook.  Patient learns that weight gain occurs because we consume more calories than we burn (eating more, moving less). Even if your body weight is normal, you may have higher ratios of fat compared to muscle mass. Too much body fat puts you at increased risk for cardiovascular disease, heart attack, stroke, type 2 diabetes, and obesity-related cancers. In addition to exercise, following the Pritikin Eating Plan can help reduce your risk.  Decoding Lab Results  Clinical staff conducted group or individual video education with verbal and written material and guidebook.  Patient learns that lab test reflects one measurement whose values change over time and are influenced by many factors, including medication, stress, sleep, exercise, food, hydration, pre-existing medical conditions, and more. It is recommended to use the knowledge from this video to become more involved with your lab results and evaluate your numbers to speak with your doctor.   Diseases of Our Time - Overview  Clinical staff conducted group or individual video education with verbal and written material and guidebook.  Patient learns that according to the CDC, 50%  to 70% of chronic diseases (such as obesity, type 2 diabetes, elevated lipids, hypertension, and heart disease) are avoidable through lifestyle improvements including healthier food choices, listening to satiety cues, and increased physical activity.  Sleep Disorders Clinical staff conducted group or individual video education with verbal and written material and guidebook.  Patient learns how good quality and duration of sleep are important to overall health and well-being. Patient also learns about sleep disorders and how they impact health along with recommendations to address them, including discussing with a physician.  Nutrition  Dining Out - Part 2 Clinical staff conducted group or individual video education with verbal and written material and guidebook.  Patient learns how to plan ahead and communicate in order to maximize their dining experience in a healthy and nutritious manner. Included are recommended food choices based on the type of restaurant the patient is visiting.   Fueling a Banker conducted group or individual video education with verbal and written material and guidebook.  There is a strong connection between  our food choices and our health. Diseases like obesity and type 2 diabetes are very prevalent and are in large-part due to lifestyle choices. The Pritikin Eating Plan provides plenty of food and hunger-curbing satisfaction. It is easy to follow, affordable, and helps reduce health risks.  Menu Workshop  Clinical staff conducted group or individual video education with verbal and written material and guidebook.  Patient learns that restaurant meals can sabotage health goals because they are often packed with calories, fat, sodium, and sugar. Recommendations include strategies to plan ahead and to communicate with the manager, chef, or server to help order a healthier meal.  Planning Your Eating Strategy  Clinical staff conducted group or individual  video education with verbal and written material and guidebook.  Patient learns about the Pritikin Eating Plan and its benefit of reducing the risk of disease. The Pritikin Eating Plan does not focus on calories. Instead, it emphasizes high-quality, nutrient-rich foods. By knowing the characteristics of the foods, we choose, we can determine their calorie density and make informed decisions.  Targeting Your Nutrition Priorities  Clinical staff conducted group or individual video education with verbal and written material and guidebook.  Patient learns that lifestyle habits have a tremendous impact on disease risk and progression. This video provides eating and physical activity recommendations based on your personal health goals, such as reducing LDL cholesterol, losing weight, preventing or controlling type 2 diabetes, and reducing high blood pressure.  Vitamins and Minerals  Clinical staff conducted group or individual video education with verbal and written material and guidebook.  Patient learns different ways to obtain key vitamins and minerals, including through a recommended healthy diet. It is important to discuss all supplements you take with your doctor.   Healthy Mind-Set    Smoking Cessation  Clinical staff conducted group or individual video education with verbal and written material and guidebook.  Patient learns that cigarette smoking and tobacco addiction pose a serious health risk which affects millions of people. Stopping smoking will significantly reduce the risk of heart disease, lung disease, and many forms of cancer. Recommended strategies for quitting are covered, including working with your doctor to develop a successful plan.  Culinary   Becoming a Set Designer conducted group or individual video education with verbal and written material and guidebook.  Patient learns that cooking at home can be healthy, cost-effective, quick, and puts them in control.  Keys to cooking healthy recipes will include looking at your recipe, assessing your equipment needs, planning ahead, making it simple, choosing cost-effective seasonal ingredients, and limiting the use of added fats, salts, and sugars.  Cooking - Breakfast and Snacks  Clinical staff conducted group or individual video education with verbal and written material and guidebook.  Patient learns how important breakfast is to satiety and nutrition through the entire day. Recommendations include key foods to eat during breakfast to help stabilize blood sugar levels and to prevent overeating at meals later in the day. Planning ahead is also a key component.  Cooking - Educational Psychologist conducted group or individual video education with verbal and written material and guidebook.  Patient learns eating strategies to improve overall health, including an approach to cook more at home. Recommendations include thinking of animal protein as a side on your plate rather than center stage and focusing instead on lower calorie dense options like vegetables, fruits, whole grains, and plant-based proteins, such as beans. Making sauces in large quantities to freeze for  later and leaving the skin on your vegetables are also recommended to maximize your experience.  Cooking - Healthy Salads and Dressing Clinical staff conducted group or individual video education with verbal and written material and guidebook.  Patient learns that vegetables, fruits, whole grains, and legumes are the foundations of the Pritikin Eating Plan. Recommendations include how to incorporate each of these in flavorful and healthy salads, and how to create homemade salad dressings. Proper handling of ingredients is also covered. Cooking - Soups and [employer] - Soups and Desserts Clinical staff conducted group or individual video education with verbal and written material and guidebook.  Patient learns that Pritikin soups and  desserts make for easy, nutritious, and delicious snacks and meal components that are low in sodium, fat, sugar, and calorie density, while high in vitamins, minerals, and filling fiber. Recommendations include simple and healthy ideas for soups and desserts.   Overview     The Pritikin Solution Program Overview Clinical staff conducted group or individual video education with verbal and written material and guidebook.  Patient learns that the results of the Pritikin Program have been documented in more than 100 articles published in peer-reviewed journals, and the benefits include reducing risk factors for (and, in some cases, even reversing) high cholesterol, high blood pressure, type 2 diabetes, obesity, and more! An overview of the three key pillars of the Pritikin Program will be covered: eating well, doing regular exercise, and having a healthy mind-set.  WORKSHOPS  Exercise: Exercise Basics: Building Your Action Plan Clinical staff led group instruction and group discussion with PowerPoint presentation and patient guidebook. To enhance the learning environment the use of posters, models and videos may be added. At the conclusion of this workshop, patients will comprehend the difference between physical activity and exercise, as well as the benefits of incorporating both, into their routine. Patients will understand the FITT (Frequency, Intensity, Time, and Type) principle and how to use it to build an exercise action plan. In addition, safety concerns and other considerations for exercise and cardiac rehab will be addressed by the presenter. The purpose of this lesson is to promote a comprehensive and effective weekly exercise routine in order to improve patients overall level of fitness.   Managing Heart Disease: Your Path to a Healthier Heart Clinical staff led group instruction and group discussion with PowerPoint presentation and patient guidebook. To enhance the learning environment  the use of posters, models and videos may be added.At the conclusion of this workshop, patients will understand the anatomy and physiology of the heart. Additionally, they will understand how Pritikins three pillars impact the risk factors, the progression, and the management of heart disease.  The purpose of this lesson is to provide a high-level overview of the heart, heart disease, and how the Pritikin lifestyle positively impacts risk factors.  Exercise Biomechanics Clinical staff led group instruction and group discussion with PowerPoint presentation and patient guidebook. To enhance the learning environment the use of posters, models and videos may be added. Patients will learn how the structural parts of their bodies function and how these functions impact their daily activities, movement, and exercise. Patients will learn how to promote a neutral spine, learn how to manage pain, and identify ways to improve their physical movement in order to promote healthy living. The purpose of this lesson is to expose patients to common physical limitations that impact physical activity. Participants will learn practical ways to adapt and manage aches and pains, and to minimize their  effect on regular exercise. Patients will learn how to maintain good posture while sitting, walking, and lifting.  Balance Training and Fall Prevention  Clinical staff led group instruction and group discussion with PowerPoint presentation and patient guidebook. To enhance the learning environment the use of posters, models and videos may be added. At the conclusion of this workshop, patients will understand the importance of their sensorimotor skills (vision, proprioception, and the vestibular system) in maintaining their ability to balance as they age. Patients will apply a variety of balancing exercises that are appropriate for their current level of function. Patients will understand the common causes for  poor balance, possible solutions to these problems, and ways to modify their physical environment in order to minimize their fall risk. The purpose of this lesson is to teach patients about the importance of maintaining balance as they age and ways to minimize their risk of falling.  WORKSHOPS   Nutrition:  Fueling a Ship Broker led group instruction and group discussion with PowerPoint presentation and patient guidebook. To enhance the learning environment the use of posters, models and videos may be added. Patients will review the foundational principles of the Pritikin Eating Plan and understand what constitutes a serving size in each of the food groups. Patients will also learn Pritikin-friendly foods that are better choices when away from home and review make-ahead meal and snack options. Calorie density will be reviewed and applied to three nutrition priorities: weight maintenance, weight loss, and weight gain. The purpose of this lesson is to reinforce (in a group setting) the key concepts around what patients are recommended to eat and how to apply these guidelines when away from home by planning and selecting Pritikin-friendly options. Patients will understand how calorie density may be adjusted for different weight management goals.  Mindful Eating  Clinical staff led group instruction and group discussion with PowerPoint presentation and patient guidebook. To enhance the learning environment the use of posters, models and videos may be added. Patients will briefly review the concepts of the Pritikin Eating Plan and the importance of low-calorie dense foods. The concept of mindful eating will be introduced as well as the importance of paying attention to internal hunger signals. Triggers for non-hunger eating and techniques for dealing with triggers will be explored. The purpose of this lesson is to provide patients with the opportunity to review the basic principles of the  Pritikin Eating Plan, discuss the value of eating mindfully and how to measure internal cues of hunger and fullness using the Hunger Scale. Patients will also discuss reasons for non-hunger eating and learn strategies to use for controlling emotional eating.  Targeting Your Nutrition Priorities Clinical staff led group instruction and group discussion with PowerPoint presentation and patient guidebook. To enhance the learning environment the use of posters, models and videos may be added. Patients will learn how to determine their genetic susceptibility to disease by reviewing their family history. Patients will gain insight into the importance of diet as part of an overall healthy lifestyle in mitigating the impact of genetics and other environmental insults. The purpose of this lesson is to provide patients with the opportunity to assess their personal nutrition priorities by looking at their family history, their own health history and current risk factors. Patients will also be able to discuss ways of prioritizing and modifying the Pritikin Eating Plan for their highest risk areas  Menu  Clinical staff led group instruction and group discussion with PowerPoint presentation and patient guidebook. To  enhance the learning environment the use of posters, models and videos may be added. Using menus brought in from [employer], or printed from [employer], patients will apply the Pritikin dining out guidelines that were presented in the [employer] video. Patients will also be able to practice these guidelines in a variety of provided scenarios. The purpose of this lesson is to provide patients with the opportunity to practice hands-on learning of the Pritikin Dining Out guidelines with actual menus and practice scenarios.  Label Reading Clinical staff led group instruction and group discussion with PowerPoint presentation and patient guidebook. To enhance the learning environment  the use of posters, models and videos may be added. Patients will review and discuss the Pritikin label reading guidelines presented in Pritikins Label Reading Educational series video. Using fool labels brought in from local grocery stores and markets, patients will apply the label reading guidelines and determine if the packaged food meet the Pritikin guidelines. The purpose of this lesson is to provide patients with the opportunity to review, discuss, and practice hands-on learning of the Pritikin Label Reading guidelines with actual packaged food labels. Cooking School  Pritikins [employer] are designed to teach patients ways to prepare quick, simple, and affordable recipes at home. The importance of nutritions role in chronic disease risk reduction is reflected in its emphasis in the overall Pritikin program. By learning how to prepare essential core Pritikin Eating Plan recipes, patients will increase control over what they eat; be able to customize the flavor of foods without the use of added salt, sugar, or fat; and improve the quality of the food they consume. By learning a set of core recipes which are easily assembled, quickly prepared, and affordable, patients are more likely to prepare more healthy foods at home. These workshops focus on convenient breakfasts, simple entres, side dishes, and desserts which can be prepared with minimal effort and are consistent with nutrition recommendations for cardiovascular risk reduction. Cooking [employer] are taught by a armed forces logistics/support/administrative officer (RD) who has been trained by the [employer]. The chef or RD has a clear understanding of the importance of minimizing - if not completely eliminating - added fat, sugar, and sodium in recipes. Throughout the series of Cooking School Workshop sessions, patients will learn about healthy ingredients and efficient methods of cooking to build confidence in their capability to  prepare    Cooking School weekly topics:  Adding Flavor- Sodium-Free  Fast and Healthy Breakfasts  Powerhouse Plant-Based Proteins  Satisfying Salads and Dressings  Simple Sides and Sauces  International Cuisine-Spotlight on the [employer] Zones  Delicious Desserts  Savory Soups  [employer] - Meals in a Astronomer Appetizers and Snacks  Comforting Weekend Breakfasts  One-Pot Wonders   Fast Evening Meals  Landscape Architect Your Pritikin Plate  WORKSHOPS   Healthy Mindset (Psychosocial):  Focused Goals, Sustainable Changes Clinical staff led group instruction and group discussion with PowerPoint presentation and patient guidebook. To enhance the learning environment the use of posters, models and videos may be added. Patients will be able to apply effective goal setting strategies to establish at least one personal goal, and then take consistent, meaningful action toward that goal. They will learn to identify common barriers to achieving personal goals and develop strategies to overcome them. Patients will also gain an understanding of how our mind-set can impact our ability to achieve goals and the importance of cultivating a positive and growth-oriented  mind-set. The purpose of this lesson is to provide patients with a deeper understanding of how to set and achieve personal goals, as well as the tools and strategies needed to overcome common obstacles which may arise along the way.  From Head to Heart: The Power of a Healthy Outlook  Clinical staff led group instruction and group discussion with PowerPoint presentation and patient guidebook. To enhance the learning environment the use of posters, models and videos may be added. Patients will be able to recognize and describe the impact of emotions and mood on physical health. They will discover the importance of self-care and explore self-care practices which may work for them. Patients will also learn how to utilize  the 4 Cs to cultivate a healthier outlook and better manage stress and challenges. The purpose of this lesson is to demonstrate to patients how a healthy outlook is an essential part of maintaining good health, especially as they continue their cardiac rehab journey.  Healthy Sleep for a Healthy Heart Clinical staff led group instruction and group discussion with PowerPoint presentation and patient guidebook. To enhance the learning environment the use of posters, models and videos may be added. At the conclusion of this workshop, patients will be able to demonstrate knowledge of the importance of sleep to overall health, well-being, and quality of life. They will understand the symptoms of, and treatments for, common sleep disorders. Patients will also be able to identify daytime and nighttime behaviors which impact sleep, and they will be able to apply these tools to help manage sleep-related challenges. The purpose of this lesson is to provide patients with a general overview of sleep and outline the importance of quality sleep. Patients will learn about a few of the most common sleep disorders. Patients will also be introduced to the concept of sleep hygiene, and discover ways to self-manage certain sleeping problems through simple daily behavior changes. Finally, the workshop will motivate patients by clarifying the links between quality sleep and their goals of heart-healthy living.   Recognizing and Reducing Stress Clinical staff led group instruction and group discussion with PowerPoint presentation and patient guidebook. To enhance the learning environment the use of posters, models and videos may be added. At the conclusion of this workshop, patients will be able to understand the types of stress reactions, differentiate between acute and chronic stress, and recognize the impact that chronic stress has on their health. They will also be able to apply different coping mechanisms, such as reframing  negative self-talk. Patients will have the opportunity to practice a variety of stress management techniques, such as deep abdominal breathing, progressive muscle relaxation, and/or guided imagery.  The purpose of this lesson is to educate patients on the role of stress in their lives and to provide healthy techniques for coping with it.  Learning Barriers/Preferences:  Learning Barriers/Preferences - 12/09/24 1016       Learning Barriers/Preferences   Learning Barriers None    Learning Preferences Written Material          Education Topics:  Knowledge Questionnaire Score:  Knowledge Questionnaire Score - 12/09/24 1017       Knowledge Questionnaire Score   Pre Score 23/24          Core Components/Risk Factors/Patient Goals at Admission:  Personal Goals and Risk Factors at Admission - 12/09/24 0903       Core Components/Risk Factors/Patient Goals on Admission   Diabetes Yes    Intervention Provide education about signs/symptoms and action to take  for hypo/hyperglycemia.;Provide education about proper nutrition, including hydration, and aerobic/resistive exercise prescription along with prescribed medications to achieve blood glucose in normal ranges: Fasting glucose 65-99 mg/dL    Expected Outcomes Short Term: Participant verbalizes understanding of the signs/symptoms and immediate care of hyper/hypoglycemia, proper foot care and importance of medication, aerobic/resistive exercise and nutrition plan for blood glucose control.;Long Term: Attainment of HbA1C < 7%.    Hypertension Yes    Intervention Provide education on lifestyle modifcations including regular physical activity/exercise, weight management, moderate sodium restriction and increased consumption of fresh fruit, vegetables, and low fat dairy, alcohol moderation, and smoking cessation.;Monitor prescription use compliance.    Expected Outcomes Short Term: Continued assessment and intervention until BP is < 140/51mm HG in  hypertensive participants. < 130/54mm HG in hypertensive participants with diabetes, heart failure or chronic kidney disease.;Long Term: Maintenance of blood pressure at goal levels.    Lipids Yes    Intervention Provide education and support for participant on nutrition & aerobic/resistive exercise along with prescribed medications to achieve LDL 70mg , HDL >40mg .    Expected Outcomes Short Term: Participant states understanding of desired cholesterol values and is compliant with medications prescribed. Participant is following exercise prescription and nutrition guidelines.;Long Term: Cholesterol controlled with medications as prescribed, with individualized exercise RX and with personalized nutrition plan. Value goals: LDL < 70mg , HDL > 40 mg.    Personal Goal Other Yes    Intervention Will continue to monitor pt and progress workloads as tolerated without sign or symptom    Expected Outcomes Pt will achieve his goals and increase strength          Core Components/Risk Factors/Patient Goals Review:   Goals and Risk Factor Review     Row Name 12/15/24 1303             Core Components/Risk Factors/Patient Goals Review   Personal Goals Review Weight Management/Obesity;Hypertension;Lipids;Diabetes       Review Anthony Townsend started cardiac rehab on 12/15/24. Anthony Townsend did well with exercise. Anthony Townsend received the okay from his cardiologist at [facility] to jog. Vital signs and CBG's were stable.       Expected Outcomes Anthony Townsend will continue to participate in cardiac rehab for exercise, nutrition and lifestyle modifications          Core Components/Risk Factors/Patient Goals at Discharge (Final Review):   Goals and Risk Factor Review - 12/15/24 1303       Core Components/Risk Factors/Patient Goals Review   Personal Goals Review Weight Management/Obesity;Hypertension;Lipids;Diabetes    Review Anthony Townsend started cardiac rehab on 12/15/24. Anthony Townsend did well with exercise. Anthony Townsend received the okay from his  cardiologist at [facility] to jog. Vital signs and CBG's were stable.    Expected Outcomes Anthony Townsend will continue to participate in cardiac rehab for exercise, nutrition and lifestyle modifications          ITP Comments:  ITP Comments     Row Name 12/09/24 0849 12/15/24 1256 12/21/24 0830       ITP Comments Anthony Bihari, MD: Medical Director. Introduction to the [facility] / Intensive Cardiac Rehab. Initial orientation packet reviewed with the patient. 30 Day ITP Review. Anthony Townsend started cardiac rehab on 12/15/24. Anthony Townsend did well with exercise. 30 Day ITP Review. Anthony Townsend started cardiac rehab on 12/15/24. Anthony Townsend is off to a great start with exercise.        Comments: See ITP comments.[name] RN BSN      [1]  Current Outpatient Medications:  aspirin 81 MG chewable tablet, Chew 81 mg by mouth daily., Disp: , Rfl:    atorvastatin (LIPITOR) 80 MG tablet, Take 80 mg by mouth daily., Disp: , Rfl:    Cholecalciferol 50 MCG (2000 UT) CAPS, Take 1 capsule by mouth every morning., Disp: , Rfl:    ibuprofen (ADVIL) 200 MG tablet, Take 200 mg by mouth every 6 (six) hours as needed for moderate pain (pain score 4-6)., Disp: , Rfl:    Insulin Disposable Pump (OMNIPOD DASH PODS, GEN 4,) MISC, Inject 1 Application into the skin every Monday, Wednesday, and Friday., Disp: , Rfl:    LYUMJEV 100 UNIT/ML SOLN, Inject 100 Units/100 mL into the skin as directed., Disp: , Rfl:    metoprolol succinate (TOPROL-XL) 25 MG 24 hr tablet, Take 12.5 mg by mouth daily., Disp: , Rfl:    ramipril (ALTACE) 10 MG capsule, Take 10 mg by mouth daily. (Patient taking differently: Take 10 mg by mouth daily. Taking 20 mg once a day), Disp: , Rfl:    ticagrelor (BRILINTA) 90 MG TABS tablet, Take 90 mg by mouth 2 (two) times daily., Disp: , Rfl:    TURMERIC PO, Take 1 tablet by mouth every morning., Disp: , Rfl:  [2]  Social History Tobacco Use  Smoking Status Never  Smokeless Tobacco Never

## 2024-12-17 ENCOUNTER — Encounter (HOSPITAL_COMMUNITY): Admission: RE | Admit: 2024-12-17 | Source: Ambulatory Visit

## 2024-12-20 ENCOUNTER — Encounter (HOSPITAL_COMMUNITY)

## 2024-12-22 ENCOUNTER — Encounter (HOSPITAL_COMMUNITY)
Admission: RE | Admit: 2024-12-22 | Discharge: 2024-12-22 | Disposition: A | Source: Ambulatory Visit | Attending: Cardiology

## 2024-12-22 DIAGNOSIS — Z48812 Encounter for surgical aftercare following surgery on the circulatory system: Secondary | ICD-10-CM | POA: Diagnosis not present

## 2024-12-22 DIAGNOSIS — Z955 Presence of coronary angioplasty implant and graft: Secondary | ICD-10-CM

## 2024-12-22 DIAGNOSIS — I2102 ST elevation (STEMI) myocardial infarction involving left anterior descending coronary artery: Secondary | ICD-10-CM

## 2024-12-24 ENCOUNTER — Encounter (HOSPITAL_COMMUNITY)
Admission: RE | Admit: 2024-12-24 | Discharge: 2024-12-24 | Disposition: A | Source: Ambulatory Visit | Attending: Cardiology

## 2024-12-24 DIAGNOSIS — Z48812 Encounter for surgical aftercare following surgery on the circulatory system: Secondary | ICD-10-CM | POA: Diagnosis not present

## 2024-12-24 DIAGNOSIS — Z955 Presence of coronary angioplasty implant and graft: Secondary | ICD-10-CM

## 2024-12-24 DIAGNOSIS — I2102 ST elevation (STEMI) myocardial infarction involving left anterior descending coronary artery: Secondary | ICD-10-CM

## 2024-12-27 ENCOUNTER — Encounter (HOSPITAL_COMMUNITY)

## 2024-12-29 ENCOUNTER — Encounter (HOSPITAL_COMMUNITY)
Admission: RE | Admit: 2024-12-29 | Discharge: 2024-12-29 | Disposition: A | Source: Ambulatory Visit | Attending: Cardiology

## 2024-12-29 DIAGNOSIS — Z955 Presence of coronary angioplasty implant and graft: Secondary | ICD-10-CM

## 2024-12-29 DIAGNOSIS — I2102 ST elevation (STEMI) myocardial infarction involving left anterior descending coronary artery: Secondary | ICD-10-CM

## 2024-12-31 ENCOUNTER — Encounter (HOSPITAL_COMMUNITY): Admission: RE | Admit: 2024-12-31 | Source: Ambulatory Visit

## 2024-12-31 DIAGNOSIS — Z955 Presence of coronary angioplasty implant and graft: Secondary | ICD-10-CM

## 2024-12-31 DIAGNOSIS — I2102 ST elevation (STEMI) myocardial infarction involving left anterior descending coronary artery: Secondary | ICD-10-CM

## 2025-01-03 ENCOUNTER — Encounter (HOSPITAL_COMMUNITY)

## 2025-01-05 ENCOUNTER — Encounter (HOSPITAL_COMMUNITY)

## 2025-01-07 ENCOUNTER — Encounter (HOSPITAL_COMMUNITY)

## 2025-01-10 ENCOUNTER — Encounter (HOSPITAL_COMMUNITY)

## 2025-01-12 ENCOUNTER — Encounter (HOSPITAL_COMMUNITY)

## 2025-01-14 ENCOUNTER — Encounter (HOSPITAL_COMMUNITY)

## 2025-01-17 ENCOUNTER — Encounter (HOSPITAL_COMMUNITY)

## 2025-01-19 ENCOUNTER — Encounter (HOSPITAL_COMMUNITY)

## 2025-01-21 ENCOUNTER — Encounter (HOSPITAL_COMMUNITY)

## 2025-01-24 ENCOUNTER — Encounter (HOSPITAL_COMMUNITY)

## 2025-01-26 ENCOUNTER — Encounter (HOSPITAL_COMMUNITY)

## 2025-01-28 ENCOUNTER — Encounter (HOSPITAL_COMMUNITY)

## 2025-01-31 ENCOUNTER — Encounter (HOSPITAL_COMMUNITY)

## 2025-02-02 ENCOUNTER — Encounter (HOSPITAL_COMMUNITY)

## 2025-02-04 ENCOUNTER — Encounter (HOSPITAL_COMMUNITY)

## 2025-02-07 ENCOUNTER — Encounter (HOSPITAL_COMMUNITY)

## 2025-02-09 ENCOUNTER — Encounter (HOSPITAL_COMMUNITY)

## 2025-02-11 ENCOUNTER — Encounter (HOSPITAL_COMMUNITY)

## 2025-02-14 ENCOUNTER — Encounter (HOSPITAL_COMMUNITY)

## 2025-02-16 ENCOUNTER — Encounter (HOSPITAL_COMMUNITY)

## 2025-02-18 ENCOUNTER — Encounter (HOSPITAL_COMMUNITY)

## 2025-02-21 ENCOUNTER — Encounter (HOSPITAL_COMMUNITY)

## 2025-02-23 ENCOUNTER — Encounter (HOSPITAL_COMMUNITY)

## 2025-02-25 ENCOUNTER — Encounter (HOSPITAL_COMMUNITY)

## 2025-02-28 ENCOUNTER — Encounter (HOSPITAL_COMMUNITY)

## 2025-03-02 ENCOUNTER — Encounter (HOSPITAL_COMMUNITY)

## 2025-03-04 ENCOUNTER — Encounter (HOSPITAL_COMMUNITY)
